# Patient Record
Sex: Female | Born: 1970 | Race: White | Hispanic: No | Marital: Married | Smoking: Never smoker
Health system: Southern US, Community
[De-identification: ages and names within clinical notes are randomized; demographics above are authoritative.]

## PROBLEM LIST (undated history)

## (undated) DIAGNOSIS — F419 Anxiety disorder, unspecified: Secondary | ICD-10-CM

## (undated) DIAGNOSIS — G4700 Insomnia, unspecified: Secondary | ICD-10-CM

## (undated) DIAGNOSIS — F32A Depression, unspecified: Secondary | ICD-10-CM

## (undated) DIAGNOSIS — K760 Fatty (change of) liver, not elsewhere classified: Secondary | ICD-10-CM

## (undated) DIAGNOSIS — E785 Hyperlipidemia, unspecified: Secondary | ICD-10-CM

## (undated) HISTORY — PX: ABDOMINAL HYSTERECTOMY: SHX81

## (undated) HISTORY — PX: COLONOSCOPY: SHX174

## (undated) HISTORY — DX: Anxiety disorder, unspecified: F41.9

## (undated) HISTORY — DX: Insomnia, unspecified: G47.00

## (undated) HISTORY — DX: Hyperlipidemia, unspecified: E78.5

## (undated) HISTORY — PX: TONSILLECTOMY: SUR1361

## (undated) HISTORY — PX: BREAST EXCISIONAL BIOPSY: SUR124

## (undated) HISTORY — DX: Depression, unspecified: F32.A

## (undated) HISTORY — DX: Fatty (change of) liver, not elsewhere classified: K76.0

## (undated) HISTORY — PX: TUBAL LIGATION: SHX77

---

## 2011-11-09 DIAGNOSIS — T884XXA Failed or difficult intubation, initial encounter: Secondary | ICD-10-CM

## 2011-11-09 HISTORY — PX: ABDOMINAL HYSTERECTOMY: SHX81

## 2011-11-09 HISTORY — DX: Failed or difficult intubation, initial encounter: T88.4XXA

## 2012-02-23 DIAGNOSIS — Z800 Family history of malignant neoplasm of digestive organs: Secondary | ICD-10-CM | POA: Insufficient documentation

## 2018-01-31 LAB — HM COLONOSCOPY

## 2019-10-29 HISTORY — PX: OOPHORECTOMY: SHX86

## 2020-06-30 DIAGNOSIS — Z803 Family history of malignant neoplasm of breast: Secondary | ICD-10-CM | POA: Insufficient documentation

## 2020-06-30 DIAGNOSIS — Z833 Family history of diabetes mellitus: Secondary | ICD-10-CM | POA: Insufficient documentation

## 2021-01-14 ENCOUNTER — Other Ambulatory Visit: Payer: Self-pay

## 2021-01-14 ENCOUNTER — Ambulatory Visit: Payer: BC Managed Care – PPO | Admitting: Family Medicine

## 2021-01-14 ENCOUNTER — Encounter: Payer: Self-pay | Admitting: Family Medicine

## 2021-01-14 VITALS — BP 130/87 | HR 81 | Temp 97.5°F | Ht 65.0 in | Wt 215.0 lb

## 2021-01-14 DIAGNOSIS — Z7689 Persons encountering health services in other specified circumstances: Secondary | ICD-10-CM

## 2021-01-14 DIAGNOSIS — Z90710 Acquired absence of both cervix and uterus: Secondary | ICD-10-CM

## 2021-01-14 DIAGNOSIS — Z23 Encounter for immunization: Secondary | ICD-10-CM | POA: Diagnosis not present

## 2021-01-14 DIAGNOSIS — E8941 Symptomatic postprocedural ovarian failure: Secondary | ICD-10-CM

## 2021-01-14 DIAGNOSIS — E782 Mixed hyperlipidemia: Secondary | ICD-10-CM

## 2021-01-14 DIAGNOSIS — G4700 Insomnia, unspecified: Secondary | ICD-10-CM | POA: Insufficient documentation

## 2021-01-14 DIAGNOSIS — Z1231 Encounter for screening mammogram for malignant neoplasm of breast: Secondary | ICD-10-CM

## 2021-01-14 DIAGNOSIS — F5101 Primary insomnia: Secondary | ICD-10-CM | POA: Diagnosis not present

## 2021-01-14 DIAGNOSIS — R030 Elevated blood-pressure reading, without diagnosis of hypertension: Secondary | ICD-10-CM | POA: Diagnosis not present

## 2021-01-14 DIAGNOSIS — Z6835 Body mass index (BMI) 35.0-35.9, adult: Secondary | ICD-10-CM

## 2021-01-14 MED ORDER — DOXEPIN HCL 10 MG PO CAPS: 10.0000 mg | ORAL_CAPSULE | Freq: Every evening | ORAL | 0 refills | Status: DC | PRN

## 2021-01-14 MED ORDER — ESTRADIOL 0.5 MG PO TABS: 0.5000 mg | ORAL_TABLET | Freq: Every day | ORAL | 6 refills | Status: DC

## 2021-01-14 MED ORDER — TIRZEPATIDE 5 MG/0.5ML ~~LOC~~ SOAJ: 5.0000 mg | SUBCUTANEOUS | 0 refills | Status: DC

## 2021-01-14 MED ORDER — MIRTAZAPINE 15 MG PO TABS: 15.0000 mg | ORAL_TABLET | Freq: Every day | ORAL | 6 refills | Status: DC

## 2021-01-14 MED ORDER — TIRZEPATIDE 2.5 MG/0.5ML ~~LOC~~ SOAJ: 2.5000 mg | SUBCUTANEOUS | 0 refills | Status: AC

## 2021-01-14 NOTE — Progress Notes (Signed)
Subjective:  Patient ID: Rebecca [name], female    DOB: 06-27-1970, 50 y.o.   MRN: [id number]  Patient Care Team: Baruch Gouty, FNP as PCP - General (Family Medicine)   Chief Complaint:  New Patient (Initial Visit) (Establish care, hx of high cholesterol)   HPI: Rebecca [name] is a 50 y.o. female presenting on 01/14/2021 for New Patient (Initial Visit) (Establish care, hx of high cholesterol)   Pt presents today to establish care with new PCP as pt has recently moved to the area from [place] state. Records have been requested, EHR database is incomplete. She moved to [place] about 1 month ago after her husband retired. She works with [employer] full time and has transitioned to her new job well. She has been a little stressed due to the move across country but feels things are starting to get better. She does have a difficult time sleeping and has been on alternating remeron and doxepin for several years to combat symptoms. She takes the doxepin for one week and the remeron on alternating weeks to keep from building up tolerance. This has worked well for her thus far. She had a total hysterectomy in 10/2018 after recurrent abnormal PAPs. During initial hysterectomy only one ovary was removed, she later developed an ovarian torsion which required the remaining ovary to be removed. She developed immediate menopausal symptoms and was started on estradiol. Since hysterectomy she has gained at least 20 lbs and continues to gain weight. She watches her diet and is active on a daily basis but unable to get the weight off and keep it off. She has tried phentermine in the past which was beneficial but this would possibly worsen her insomnia. She reports her LDL has been elevated in the past but has never been placed on medications.      Relevant past medical, surgical, family, and social history reviewed and updated as indicated.  Allergies and medications reviewed and  updated. Data reviewed: Chart in Epic.   Past Medical History:  Diagnosis Date   Anxiety    Hyperlipidemia     Past Surgical History:  Procedure Laterality Date   ABDOMINAL HYSTERECTOMY     TONSILLECTOMY Bilateral     Social History   Socioeconomic History   Marital status: Married    Spouse name: Not on file   Number of children: Not on file   Years of education: Not on file   Highest education level: Not on file  Occupational History   Not on file  Tobacco Use   Smoking status: Never   Smokeless tobacco: Never  Substance and Sexual Activity   Alcohol use: Yes    Comment: 1-2 drinks per week   Drug use: Not on file   Sexual activity: Not on file  Other Topics Concern   Not on file  Social History Narrative   Not on file   Social Determinants of Health   Financial Resource Strain: Not on file  Food Insecurity: Not on file  Transportation Needs: Not on file  Physical Activity: Not on file  Stress: Not on file  Social Connections: Not on file  Intimate Partner Violence: Not on file    Outpatient Encounter Medications as of 01/14/2021  Medication Sig   tirzepatide (MOUNJARO) 2.5 MG/0.5ML Pen Inject 2.5 mg into the skin once a week for 4 doses.   [START ON 02/12/2021] tirzepatide (MOUNJARO) 5 MG/0.5ML Pen Inject 5 mg into the skin once a week  for 8 doses.   [DISCONTINUED] doxepin (SINEQUAN) 10 MG capsule Take 10 mg by mouth.   [DISCONTINUED] estradiol (ESTRACE) 0.5 MG tablet Take 0.5 mg by mouth daily.   [DISCONTINUED] mirtazapine (REMERON) 15 MG tablet Take 15 mg by mouth at bedtime.   doxepin (SINEQUAN) 10 MG capsule Take 1 capsule (10 mg total) by mouth at bedtime as needed. Take 1-2 capsules at night as needed for insomnia   estradiol (ESTRACE) 0.5 MG tablet Take 1 tablet (0.5 mg total) by mouth daily.   mirtazapine (REMERON) 15 MG tablet Take 1 tablet (15 mg total) by mouth at bedtime.   No facility-administered encounter medications on file as of  01/14/2021.    No Known Allergies  Review of Systems  Constitutional:  Positive for unexpected weight change (weight gain). Negative for activity change, appetite change, chills, diaphoresis, fatigue and fever.  HENT: Negative.    Eyes: Negative.   Respiratory:  Negative for cough, chest tightness and shortness of breath.   Cardiovascular:  Negative for chest pain, palpitations and leg swelling.  Gastrointestinal:  Negative for blood in stool, constipation, diarrhea, nausea and vomiting.  Endocrine: Negative.  Negative for cold intolerance, heat intolerance, polydipsia, polyphagia and polyuria.  Genitourinary:  Negative for dysuria, frequency and urgency.       Hot flashes  Musculoskeletal:  Negative for arthralgias and myalgias.  Skin: Negative.   Allergic/Immunologic: Negative.   Neurological:  Negative for dizziness and headaches.  Hematological: Negative.   Psychiatric/Behavioral:  Positive for decreased concentration and sleep disturbance. Negative for confusion, hallucinations, self-injury and suicidal ideas.   All other systems reviewed and are negative.      Objective:  BP 130/87   Pulse 81   Temp (!) 97.5 F (36.4 C)   Ht _0  (1.651 m)   Wt 215 lb (97.5 kg)   LMP 10/30/2018 (Approximate) Comment: total hysterectmy  SpO2 97%   BMI 35.78 kg/m    Wt Readings from Last 3 Encounters:  01/14/21 215 lb (97.5 kg)    Physical Exam Vitals and nursing note reviewed.  Constitutional:      General: She is not in acute distress.    Appearance: Normal appearance. She is well-developed and well-groomed. She is obese. She is not ill-appearing, toxic-appearing or diaphoretic.  HENT:     Head: Normocephalic and atraumatic.     Jaw: There is normal jaw occlusion.     Right Ear: Hearing normal.     Left Ear: Hearing normal.     Nose: Nose normal.     Mouth/Throat:     Lips: Pink.     Mouth: Mucous membranes are moist.     Pharynx: Oropharynx is clear. Uvula midline.   Eyes:     General: Lids are normal.     Extraocular Movements: Extraocular movements intact.     Conjunctiva/sclera: Conjunctivae normal.     Pupils: Pupils are equal, round, and reactive to light.  Neck:     Thyroid: No thyroid mass, thyromegaly or thyroid tenderness.     Vascular: No carotid bruit or JVD.     Trachea: Trachea and phonation normal.  Cardiovascular:     Rate and Rhythm: Normal rate and regular rhythm.     Chest Wall: PMI is not displaced.     Pulses: Normal pulses.     Heart sounds: Normal heart sounds. No murmur heard.   No friction rub. No gallop.  Pulmonary:     Effort: Pulmonary effort is normal. No  respiratory distress.     Breath sounds: Normal breath sounds. No wheezing.  Abdominal:     General: Abdomen is protuberant. Bowel sounds are normal. There is no distension or abdominal bruit.     Palpations: Abdomen is soft. There is no hepatomegaly or splenomegaly.     Tenderness: There is no abdominal tenderness. There is no right CVA tenderness or left CVA tenderness.     Hernia: No hernia is present.  Musculoskeletal:        General: Normal range of motion.     Cervical back: Normal range of motion and neck supple.     Right lower leg: No edema.     Left lower leg: No edema.  Lymphadenopathy:     Cervical: No cervical adenopathy.  Skin:    General: Skin is warm and dry.     Capillary Refill: Capillary refill takes less than 2 seconds.     Coloration: Skin is not cyanotic, jaundiced or pale.     Findings: No rash.  Neurological:     General: No focal deficit present.     Mental Status: She is alert and oriented to person, place, and time.     Cranial Nerves: Cranial nerves are intact.     Sensory: Sensation is intact.     Motor: Motor function is intact.     Coordination: Coordination is intact.     Gait: Gait is intact. Gait normal.     Deep Tendon Reflexes: Reflexes are normal and symmetric.  Psychiatric:        Attention and Perception: Attention  and perception normal.        Mood and Affect: Mood and affect normal.        Speech: Speech normal.        Behavior: Behavior normal. Behavior is cooperative.        Thought Content: Thought content normal.        Cognition and Memory: Cognition and memory normal.        Judgment: Judgment normal.       Pertinent labs & imaging results that were available during my care of the patient were reviewed by me and considered in my medical decision making.  Assessment & Plan:  Rithika was seen today for new patient (initial visit).  Diagnoses and all orders for this visit:  Mixed hyperlipidemia Diet and exercise encouraged. Will check labs. Statin therapy will be initiated if indicated.  -     Lipid panel -     Hemoglobin A1c  Primary insomnia Has been doing well on alternating doxepin and remeron, will continue. Will check thyroid function. -     Thyroid Panel With TSH -     doxepin (SINEQUAN) 10 MG capsule; Take 1 capsule (10 mg total) by mouth at bedtime as needed. Take 1-2 capsules at night as needed for insomnia -     mirtazapine (REMERON) 15 MG tablet; Take 1 tablet (15 mg total) by mouth at bedtime.  Elevated blood pressure without diagnosis of hypertension Normal reading for pt 120/70 at home. DASH diet and exercise encouraged. Pt to monitor BP and report any persistent high readings. Labs pending.  -     CMP14+EGFR -     CBC with Differential/Platelet -     Lipid panel -     Thyroid Panel With TSH -     Hemoglobin A1c  BMI 35.0-35.9,adult Encounter for weight management Diet and exercise encouraged. Has been on phentermine in the past  but this would likely increase insomnia symptoms. Labs pending. No personal or family history of thyroid cancer. Will initiate Mounjaro as prescribed. Follow up in 6 weeks for BMI recheck.  -     CMP14+EGFR -     CBC with Differential/Platelet -     Lipid panel -     Thyroid Panel With TSH -     Hemoglobin A1c -     tirzepatide (MOUNJARO) 5  MG/0.5ML Pen; Inject 5 mg into the skin once a week for 8 doses. -     tirzepatide [facility]) 2.5 MG/0.5ML Pen; Inject 2.5 mg into the skin once a week for 4 doses.  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 Pt will try to decrease dose by half to see if beneficial. If dose does not worsen symptoms, will continue at lower dose.  -     estradiol (ESTRACE) 0.5 MG tablet; Take 1 tablet (0.5 mg total) by mouth daily.  Need for influenza vaccination -     Flu Vaccine QUAD 6+ mos PF IM (Fluarix Quad PF)  Encounter to establish care EHR database is incomplete, records have been requested. Will update health maintenance once records are received.   Screening for breast cancer Mammogram ordered. Pt would like to have on mobile imaging bus.  Continue all other maintenance medications.  Follow up plan: Return in about 6 weeks (around 02/25/2021), or if symptoms worsen or fail to improve, for BMI.   Continue healthy lifestyle choices, including diet (rich in fruits, vegetables, and lean proteins, and low in salt and simple carbohydrates) and exercise (at least 30 minutes of moderate physical activity daily).  Educational handout given for weight loss  The above assessment and management plan was discussed with the patient. The patient verbalized understanding of and has agreed to the management plan. Patient is aware to call the clinic if they develop any new symptoms or if symptoms persist or worsen. Patient is aware when to return to the clinic for a follow-up visit. Patient educated on when it is appropriate to go to the emergency department.   Monia Pouch, FNP-C [facility] [phone]

## 2021-01-15 ENCOUNTER — Encounter: Payer: Self-pay | Admitting: Family Medicine

## 2021-01-15 LAB — CBC WITH DIFFERENTIAL/PLATELET
Basophils Absolute: 0 10*3/uL (ref 0.0–0.2)
Basos: 0 %
EOS (ABSOLUTE): 0.1 10*3/uL (ref 0.0–0.4)
Eos: 1 %
Hematocrit: 38 % (ref 34.0–46.6)
Hemoglobin: 13.1 g/dL (ref 11.1–15.9)
Immature Grans (Abs): 0 10*3/uL (ref 0.0–0.1)
Immature Granulocytes: 0 %
Lymphocytes Absolute: 3.5 10*3/uL — ABNORMAL HIGH (ref 0.7–3.1)
Lymphs: 38 %
MCH: 30.9 pg (ref 26.6–33.0)
MCHC: 34.5 g/dL (ref 31.5–35.7)
MCV: 90 fL (ref 79–97)
Monocytes Absolute: 0.6 10*3/uL (ref 0.1–0.9)
Monocytes: 7 %
Neutrophils Absolute: 5 10*3/uL (ref 1.4–7.0)
Neutrophils: 54 %
Platelets: 295 10*3/uL (ref 150–450)
RBC: 4.24 x10E6/uL (ref 3.77–5.28)
RDW: 13.1 % (ref 11.7–15.4)
WBC: 9.2 10*3/uL (ref 3.4–10.8)

## 2021-01-15 LAB — CMP14+EGFR
ALT: 46 IU/L — ABNORMAL HIGH (ref 0–32)
AST: 28 IU/L (ref 0–40)
Albumin/Globulin Ratio: 1.6 (ref 1.2–2.2)
Albumin: 4.5 g/dL (ref 3.8–4.8)
Alkaline Phosphatase: 77 IU/L (ref 44–121)
BUN/Creatinine Ratio: 13 (ref 9–23)
BUN: 12 mg/dL (ref 6–24)
Bilirubin Total: 0.3 mg/dL (ref 0.0–1.2)
CO2: 23 mmol/L (ref 20–29)
Calcium: 9.7 mg/dL (ref 8.7–10.2)
Chloride: 100 mmol/L (ref 96–106)
Creatinine, Ser: 0.9 mg/dL (ref 0.57–1.00)
Globulin, Total: 2.8 g/dL (ref 1.5–4.5)
Glucose: 87 mg/dL (ref 65–99)
Potassium: 3.8 mmol/L (ref 3.5–5.2)
Sodium: 139 mmol/L (ref 134–144)
Total Protein: 7.3 g/dL (ref 6.0–8.5)
eGFR: 79 mL/min/{1.73_m2} (ref >59)

## 2021-01-15 LAB — THYROID PANEL WITH TSH
Free Thyroxine Index: 1.9 (ref 1.2–4.9)
T3 Uptake Ratio: 24 % (ref 24–39)
T4, Total: 8 ug/dL (ref 4.5–12.0)
TSH: 2.39 u[IU]/mL (ref 0.450–4.500)

## 2021-01-15 LAB — LIPID PANEL
Chol/HDL Ratio: 5 ratio — ABNORMAL HIGH (ref 0.0–4.4)
Cholesterol, Total: 254 mg/dL — ABNORMAL HIGH (ref 100–199)
HDL: 51 mg/dL (ref >39)
LDL Chol Calc (NIH): 176 mg/dL — ABNORMAL HIGH (ref 0–99)
Triglycerides: 146 mg/dL (ref 0–149)
VLDL Cholesterol Cal: 27 mg/dL (ref 5–40)

## 2021-01-15 LAB — HEMOGLOBIN A1C
Est. average glucose Bld gHb Est-mCnc: 111 mg/dL
Hgb A1c MFr Bld: 5.5 % (ref 4.8–5.6)

## 2021-01-15 MED ORDER — ATORVASTATIN CALCIUM 20 MG PO TABS
20.0000 mg | ORAL_TABLET | Freq: Every day | ORAL | 3 refills | Status: DC
Start: 2021-01-15 — End: 2021-12-02

## 2021-01-15 NOTE — Addendum Note (Signed)
Addended by: Sonny Masters on: 01/15/2021 09:13 AM   Modules accepted: Orders

## 2021-01-15 NOTE — Telephone Encounter (Signed)
The ALT is minimally elevated. We will recheck it at next visit. If still elevated, we will discuss medications and potential causes other than tylenol.

## 2021-01-25 ENCOUNTER — Telehealth: Payer: Self-pay | Admitting: *Deleted

## 2021-01-25 DIAGNOSIS — Z6835 Body mass index (BMI) 35.0-35.9, adult: Secondary | ICD-10-CM

## 2021-01-25 NOTE — Telephone Encounter (Signed)
Mounjaro 2.5MG /0.5ML pen-injectors PA started - weight loss   (Key: [id number])   Form [facility] Commercial General Form General Prior Authorization Form for [employer] members [phone]  PA not initiated - coupon available

## 2021-02-04 ENCOUNTER — Encounter: Payer: Self-pay | Admitting: *Deleted

## 2021-03-02 ENCOUNTER — Ambulatory Visit: Payer: Self-pay | Admitting: Family Medicine

## 2021-03-02 ENCOUNTER — Ambulatory Visit: Payer: BC Managed Care – PPO | Admitting: Family Medicine

## 2021-03-02 ENCOUNTER — Encounter: Payer: Self-pay | Admitting: Family Medicine

## 2021-03-02 ENCOUNTER — Other Ambulatory Visit: Payer: Self-pay

## 2021-03-02 VITALS — BP 127/81 | HR 95 | Temp 98.0°F | Ht 66.0 in | Wt 211.8 lb

## 2021-03-02 DIAGNOSIS — R030 Elevated blood-pressure reading, without diagnosis of hypertension: Secondary | ICD-10-CM | POA: Diagnosis not present

## 2021-03-02 DIAGNOSIS — Z6835 Body mass index (BMI) 35.0-35.9, adult: Secondary | ICD-10-CM

## 2021-03-02 DIAGNOSIS — K219 Gastro-esophageal reflux disease without esophagitis: Secondary | ICD-10-CM | POA: Diagnosis not present

## 2021-03-02 DIAGNOSIS — Z789 Other specified health status: Secondary | ICD-10-CM | POA: Diagnosis not present

## 2021-03-02 MED ORDER — TIRZEPATIDE 7.5 MG/0.5ML ~~LOC~~ SOAJ: 7.5000 mg | SUBCUTANEOUS | 0 refills | Status: AC

## 2021-03-02 MED ORDER — OMEPRAZOLE 20 MG PO CPDR
20.0000 mg | DELAYED_RELEASE_CAPSULE | Freq: Every day | ORAL | 3 refills | Status: DC
Start: 2021-03-02 — End: 2021-06-21

## 2021-03-02 MED ORDER — TIRZEPATIDE 10 MG/0.5ML ~~LOC~~ SOAJ: 10.0000 mg | SUBCUTANEOUS | 0 refills | Status: AC

## 2021-03-02 NOTE — Progress Notes (Signed)
Subjective:  Patient ID: Rebecca [name], female    DOB: 1970-10-21, 50 y.o.   MRN: [id number]  Patient Care Team: Baruch Gouty, FNP as PCP - General (Family Medicine)   Chief Complaint:  Weight Check [name])   HPI: Rebecca [name] is a 50 y.o. female presenting on 03/02/2021 for Weight Check [name])   Patient presents today for follow-up for weight loss management.  She has been limiting her intake of fatty foods.  She has been more cognizant about her water intake and doing better with such.  She is active on a regular basis but does not follow a exercise routine.  She states she did notice when she increased to the 5 mg of Mounjaro that she started having acid reflux.  She has been taking times for this with control of symptoms.  No cough, voice change, trouble swallowing, hemoptysis, or dysphagia.  No abdominal pain.  She does have slight constipation which is relieved by senna. She continues to have periods of insomnia but she is only been taking half of a 5 mg tablet of melatonin at night.  Increasing dose discussed in detail.    Relevant past medical, surgical, family, and social history reviewed and updated as indicated.  Allergies and medications reviewed and updated. Data reviewed: Chart in Epic.   Past Medical History:  Diagnosis Date   Anxiety    Hyperlipidemia    Insomnia     Past Surgical History:  Procedure Laterality Date   ABDOMINAL HYSTERECTOMY     TONSILLECTOMY Bilateral     Social History   Socioeconomic History   Marital status: Married    Spouse name: Not on file   Number of children: Not on file   Years of education: Not on file   Highest education level: Not on file  Occupational History   Occupation: [employer]  Tobacco Use   Smoking status: Never   Smokeless tobacco: Never  Vaping Use   Vaping Use: Never used  Substance and Sexual Activity   Alcohol use: Yes    Comment: 1-2 drinks per week   Drug use:  Never   Sexual activity: Yes    Comment: total hysterectomy  Other Topics Concern   Not on file  Social History Narrative   Not on file   Social Determinants of Health   Financial Resource Strain: Not on file  Food Insecurity: Not on file  Transportation Needs: Not on file  Physical Activity: Not on file  Stress: Not on file  Social Connections: Not on file  Intimate Partner Violence: Not on file    Outpatient Encounter Medications as of 03/02/2021  Medication Sig   omeprazole (PRILOSEC) 20 MG capsule Take 1 capsule (20 mg total) by mouth daily.   [START ON 04/06/2021] tirzepatide (MOUNJARO) 10 MG/0.5ML Pen Inject 10 mg into the skin once a week for 4 doses.   tirzepatide (MOUNJARO) 7.5 MG/0.5ML Pen Inject 7.5 mg into the skin once a week for 4 doses.   atorvastatin (LIPITOR) 20 MG tablet Take 1 tablet (20 mg total) by mouth daily.   doxepin (SINEQUAN) 10 MG capsule Take 1 capsule (10 mg total) by mouth at bedtime as needed. Take 1-2 capsules at night as needed for insomnia   estradiol (ESTRACE) 0.5 MG tablet Take 1 tablet (0.5 mg total) by mouth daily.   mirtazapine (REMERON) 15 MG tablet Take 1 tablet (15 mg total) by mouth at bedtime.   [DISCONTINUED] tirzepatide [name])  5 MG/0.5ML Pen Inject 5 mg into the skin once a week for 8 doses.   No facility-administered encounter medications on file as of 03/02/2021.    No Known Allergies  Review of Systems  Constitutional:  Negative for activity change, appetite change, chills, diaphoresis, fatigue, fever and unexpected weight change.  HENT: Negative.  Negative for sore throat, trouble swallowing and voice change.   Eyes: Negative.   Respiratory:  Negative for apnea, cough, choking, chest tightness, shortness of breath, wheezing and stridor.   Cardiovascular:  Negative for chest pain, palpitations and leg swelling.  Gastrointestinal:  Negative for abdominal distention, abdominal pain, anal bleeding, blood in stool, constipation,  diarrhea, nausea, rectal pain and vomiting.       GERD  Endocrine: Negative.   Genitourinary:  Negative for decreased urine volume, difficulty urinating, dysuria, frequency and urgency.  Musculoskeletal:  Negative for arthralgias and myalgias.  Skin: Negative.   Allergic/Immunologic: Negative.   Neurological:  Negative for dizziness, tremors, seizures, syncope, facial asymmetry, speech difficulty, weakness, light-headedness, numbness and headaches.  Hematological: Negative.   Psychiatric/Behavioral:  Positive for sleep disturbance. Negative for agitation, behavioral problems, confusion, decreased concentration, dysphoric mood, hallucinations, self-injury and suicidal ideas. The patient is not nervous/anxious and is not hyperactive.   All other systems reviewed and are negative.      Objective:  BP 127/81   Pulse 95   Temp 98 F (36.7 C)   Ht '5\' 6"'  (1.676 m)   Wt 211 lb 12.8 oz (96.1 kg)   LMP 10/30/2018 (Approximate) Comment: total hysterectmy  SpO2 96%   BMI 34.19 kg/m    Wt Readings from Last 3 Encounters:  03/02/21 211 lb 12.8 oz (96.1 kg)  01/14/21 215 lb (97.5 kg)    Physical Exam Vitals and nursing note reviewed.  Constitutional:      General: She is not in acute distress.    Appearance: Normal appearance. She is well-developed and well-groomed. She is obese. She is not ill-appearing, toxic-appearing or diaphoretic.  HENT:     Head: Normocephalic and atraumatic.     Jaw: There is normal jaw occlusion.     Right Ear: Hearing normal.     Left Ear: Hearing normal.     Nose: Nose normal.     Mouth/Throat:     Lips: Pink.     Mouth: Mucous membranes are moist.     Pharynx: Oropharynx is clear. Uvula midline.  Eyes:     General: Lids are normal.     Extraocular Movements: Extraocular movements intact.     Conjunctiva/sclera: Conjunctivae normal.     Pupils: Pupils are equal, round, and reactive to light.  Neck:     Thyroid: No thyroid mass, thyromegaly or thyroid  tenderness.     Vascular: No carotid bruit or JVD.     Trachea: Trachea and phonation normal.  Cardiovascular:     Rate and Rhythm: Normal rate and regular rhythm.     Chest Wall: PMI is not displaced.     Pulses: Normal pulses.     Heart sounds: Normal heart sounds. No murmur heard.   No friction rub. No gallop.  Pulmonary:     Effort: Pulmonary effort is normal. No respiratory distress.     Breath sounds: Normal breath sounds. No wheezing.  Abdominal:     General: Bowel sounds are normal. There is no distension or abdominal bruit.     Palpations: Abdomen is soft. There is no hepatomegaly or splenomegaly.  Tenderness: There is no abdominal tenderness. There is no right CVA tenderness or left CVA tenderness.     Hernia: No hernia is present.  Musculoskeletal:        General: Normal range of motion.     Cervical back: Normal range of motion and neck supple.     Right lower leg: No edema.     Left lower leg: No edema.  Lymphadenopathy:     Cervical: No cervical adenopathy.  Skin:    General: Skin is warm and dry.     Capillary Refill: Capillary refill takes less than 2 seconds.     Coloration: Skin is not cyanotic, jaundiced or pale.     Findings: No rash.  Neurological:     General: No focal deficit present.     Mental Status: She is alert and oriented to person, place, and time.     Sensory: Sensation is intact.     Motor: Motor function is intact.     Coordination: Coordination is intact.     Gait: Gait is intact.     Deep Tendon Reflexes: Reflexes are normal and symmetric.  Psychiatric:        Attention and Perception: Attention and perception normal.        Mood and Affect: Mood and affect normal.        Speech: Speech normal.        Behavior: Behavior normal. Behavior is cooperative.        Thought Content: Thought content normal.        Cognition and Memory: Cognition and memory normal.        Judgment: Judgment normal.    Results for orders placed or performed  in visit on 02/04/21  HM COLONOSCOPY  Result Value Ref Range   HM Colonoscopy See Report (in chart) See Report (in chart), Patient Reported       Pertinent labs & imaging results that were available during my care of the patient were reviewed by me and considered in my medical decision making.  Assessment & Plan:  Manasa was seen today for weight check.  Diagnoses and all orders for this visit:  BMI 35.0-35.9,adult Educated about management of weight Has lost 4 pounds on [place].  Diet and exercise encouraged.  Will increase dose to 7.5 mg/week for 4 weeks and then 10 mg/week for 4 weeks.  We will recheck CBC and CMP today. -     CMP14+EGFR -     CBC with Differential/Platelet -     tirzepatide (MOUNJARO) 7.5 MG/0.5ML Pen; Inject 7.5 mg into the skin once a week for 4 doses. -     tirzepatide (MOUNJARO) 10 MG/0.5ML Pen; Inject 10 mg into the skin once a week for 4 doses.  Elevated blood pressure reading without diagnosis of hypertension Better controlled with weight loss and decreased dose of hormone replacement therapy. DASHDiet and exercise encouraged.  Monitor blood pressure at home and report any persistent high readings. -     CMP14+EGFR -     CBC with Differential/Platelet  Gastroesophageal reflux disease without esophagitis Acid reflux symptoms with Mounjaro therapy.  We will add omeprazole as prescribed.  No red flags present.  Patient aware to report any new or worsening symptoms. -     omeprazole (PRILOSEC) 20 MG capsule; Take 1 capsule (20 mg total) by mouth daily.    Continue all other maintenance medications.  Follow up plan: Return in about 8 years (around 03/02/2029), or if symptoms worsen  or fail to improve, for BMI.   Continue healthy lifestyle choices, including diet (rich in fruits, vegetables, and lean proteins, and low in salt and simple carbohydrates) and exercise (at least 30 minutes of moderate physical activity daily).  Educational handout given for  insomnia   The above assessment and management plan was discussed with the patient. The patient verbalized understanding of and has agreed to the management plan. Patient is aware to call the clinic if they develop any new symptoms or if symptoms persist or worsen. Patient is aware when to return to the clinic for a follow-up visit. Patient educated on when it is appropriate to go to the emergency department.   Monia Pouch, FNP-C [facility] [phone]

## 2021-03-03 LAB — CMP14+EGFR
ALT: 42 IU/L — ABNORMAL HIGH (ref 0–32)
AST: 22 IU/L (ref 0–40)
Albumin/Globulin Ratio: 1.6 (ref 1.2–2.2)
Albumin: 4.7 g/dL (ref 3.8–4.8)
Alkaline Phosphatase: 100 IU/L (ref 44–121)
BUN/Creatinine Ratio: 12 (ref 9–23)
BUN: 10 mg/dL (ref 6–24)
Bilirubin Total: 0.3 mg/dL (ref 0.0–1.2)
CO2: 25 mmol/L (ref 20–29)
Calcium: 10.8 mg/dL — ABNORMAL HIGH (ref 8.7–10.2)
Chloride: 99 mmol/L (ref 96–106)
Creatinine, Ser: 0.84 mg/dL (ref 0.57–1.00)
Globulin, Total: 2.9 g/dL (ref 1.5–4.5)
Glucose: 73 mg/dL (ref 70–99)
Potassium: 4.2 mmol/L (ref 3.5–5.2)
Sodium: 138 mmol/L (ref 134–144)
Total Protein: 7.6 g/dL (ref 6.0–8.5)
eGFR: 86 mL/min/{1.73_m2} (ref >59)

## 2021-03-03 LAB — CBC WITH DIFFERENTIAL/PLATELET
Basophils Absolute: 0.1 10*3/uL (ref 0.0–0.2)
Basos: 1 %
EOS (ABSOLUTE): 0 10*3/uL (ref 0.0–0.4)
Eos: 0 %
Hematocrit: 43.3 % (ref 34.0–46.6)
Hemoglobin: 14.9 g/dL (ref 11.1–15.9)
Immature Grans (Abs): 0 10*3/uL (ref 0.0–0.1)
Immature Granulocytes: 0 %
Lymphocytes Absolute: 3.5 10*3/uL — ABNORMAL HIGH (ref 0.7–3.1)
Lymphs: 34 %
MCH: 31.1 pg (ref 26.6–33.0)
MCHC: 34.4 g/dL (ref 31.5–35.7)
MCV: 90 fL (ref 79–97)
Monocytes Absolute: 0.7 10*3/uL (ref 0.1–0.9)
Monocytes: 7 %
Neutrophils Absolute: 5.9 10*3/uL (ref 1.4–7.0)
Neutrophils: 58 %
Platelets: 320 10*3/uL (ref 150–450)
RBC: 4.79 x10E6/uL (ref 3.77–5.28)
RDW: 12.9 % (ref 11.7–15.4)
WBC: 10.2 10*3/uL (ref 3.4–10.8)

## 2021-04-27 ENCOUNTER — Ambulatory Visit: Payer: BC Managed Care – PPO | Admitting: Family Medicine

## 2021-04-28 ENCOUNTER — Encounter: Payer: Self-pay | Admitting: Family Medicine

## 2021-04-28 ENCOUNTER — Other Ambulatory Visit: Payer: Self-pay

## 2021-04-28 ENCOUNTER — Ambulatory Visit
Admission: RE | Admit: 2021-04-28 | Discharge: 2021-04-28 | Disposition: A | Discharge status: Home / Self Care | Payer: Self-pay | Source: Ambulatory Visit | Attending: Family Medicine | Admitting: Family Medicine

## 2021-04-28 ENCOUNTER — Ambulatory Visit: Payer: BC Managed Care – PPO | Admitting: Family Medicine

## 2021-04-28 VITALS — BP 120/80 | HR 90 | Temp 97.7°F | Ht 66.0 in | Wt 213.2 lb

## 2021-04-28 DIAGNOSIS — Z7689 Persons encountering health services in other specified circumstances: Secondary | ICD-10-CM

## 2021-04-28 DIAGNOSIS — Z6834 Body mass index (BMI) 34.0-34.9, adult: Secondary | ICD-10-CM

## 2021-04-28 DIAGNOSIS — F320 Major depressive disorder, single episode, mild: Secondary | ICD-10-CM | POA: Diagnosis not present

## 2021-04-28 DIAGNOSIS — Z789 Other specified health status: Secondary | ICD-10-CM | POA: Diagnosis not present

## 2021-04-28 DIAGNOSIS — F5101 Primary insomnia: Secondary | ICD-10-CM | POA: Diagnosis not present

## 2021-04-28 DIAGNOSIS — F411 Generalized anxiety disorder: Secondary | ICD-10-CM

## 2021-04-28 DIAGNOSIS — Z1231 Encounter for screening mammogram for malignant neoplasm of breast: Secondary | ICD-10-CM

## 2021-04-28 MED ORDER — TIRZEPATIDE 12.5 MG/0.5ML ~~LOC~~ SOAJ: 12.5000 mg | SUBCUTANEOUS | 0 refills | Status: AC

## 2021-04-28 MED ORDER — DOXEPIN HCL 25 MG PO CAPS: 25.0000 mg | ORAL_CAPSULE | Freq: Every day | ORAL | 5 refills | Status: DC

## 2021-04-28 MED ORDER — FLUOXETINE HCL 10 MG PO CAPS: 10.0000 mg | ORAL_CAPSULE | Freq: Every day | ORAL | 3 refills | Status: DC

## 2021-04-28 NOTE — Patient Instructions (Signed)

## 2021-04-28 NOTE — Progress Notes (Signed)
Subjective:  Patient ID: Rebecca [name], female    DOB: Mar 22, 1971, 50 y.o.   MRN: [id number]  Patient Care Team: Baruch Gouty, FNP as PCP - General (Family Medicine)   Chief Complaint:  Insomnia and Obesity   HPI: Rebecca [name] is a 50 y.o. female presenting on 04/28/2021 for Insomnia and Obesity   Patient presents today for follow-up for weight management.  She was started on Rytary and has titrated up to 10 mg weekly dosing.  She has actually gained today not lost.  States her diet has not changed but she does not follow a strict diet either.  She states her only exercise is walking her dog daily.  She does work in a [employer] and is on her feet for 12 hours a day.  24-hour diet recall: Breakfast coffee, lunch what ever the school has that day, dinner meat and veggies.  She states she does not snack throughout the day and avoids bread and pasta.  When she does eat bread it is the wheat sammies.  She states she does drink Gatorade, ice water, caffeine free diet Pepsi, and sweet tea.  She also reports worsening insomnia, anxiety, and depression.  States that she is anxious most of the time and has trouble concentrating.  She denies any SI or HI.  She was on Effexor in the past, did not tolerate due to weight problems.  She is currently on lorazepam as needed for sleep and doxepin as needed for sleep.  Does not take both at the same time.  Depression screen [facility] 2/9 04/28/2021 03/02/2021 01/14/2021  Decreased Interest 1 0 0  Down, Depressed, Hopeless 1 0 0  PHQ - 2 Score 2 0 0  Altered sleeping '3 3 3  ' Tired, decreased energy 2 0 0  Change in appetite 1 0 0  Feeling bad or failure about yourself  1 0 0  Trouble concentrating 3 1 0  Moving slowly or fidgety/restless 0 0 0  Suicidal thoughts 0 0 0  PHQ-9 Score '12 4 3  ' Difficult doing work/chores Somewhat difficult Somewhat difficult -   GAD 7 : Generalized Anxiety Score 04/28/2021 03/02/2021 01/14/2021  Nervous,  Anxious, on Edge '1 2 3  ' Control/stop worrying '1 2 3  ' Worry too much - different things '1 2 2  ' Trouble relaxing '1 2 2  ' Restless '1 2 2  ' Easily annoyed or irritable '1 2 1  ' Afraid - awful might happen 0 1 0  Total GAD 7 Score '6 13 13  ' Anxiety Difficulty Somewhat difficult Somewhat difficult -         Relevant past medical, surgical, family, and social history reviewed and updated as indicated.  Allergies and medications reviewed and updated. Data reviewed: Chart in Epic.   Past Medical History:  Diagnosis Date   Anxiety    Hyperlipidemia    Insomnia     Past Surgical History:  Procedure Laterality Date   ABDOMINAL HYSTERECTOMY     TONSILLECTOMY Bilateral     Social History   Socioeconomic History   Marital status: Married    Spouse name: Not on file   Number of children: Not on file   Years of education: Not on file   Highest education level: Not on file  Occupational History   Occupation: [employer]  Tobacco Use   Smoking status: Never   Smokeless tobacco: Never  Vaping Use   Vaping Use: Never used  Substance and Sexual  Activity   Alcohol use: Yes    Comment: 1-2 drinks per week   Drug use: Never   Sexual activity: Yes    Comment: total hysterectomy  Other Topics Concern   Not on file  Social History Narrative   Not on file   Social Determinants of Health   Financial Resource Strain: Not on file  Food Insecurity: Not on file  Transportation Needs: Not on file  Physical Activity: Not on file  Stress: Not on file  Social Connections: Not on file  Intimate Partner Violence: Not on file    Outpatient Encounter Medications as of 04/28/2021  Medication Sig   atorvastatin (LIPITOR) 20 MG tablet Take 1 tablet (20 mg total) by mouth daily.   doxepin (SINEQUAN) 25 MG capsule Take 1 capsule (25 mg total) by mouth at bedtime.   estradiol (ESTRACE) 0.5 MG tablet Take 1 tablet (0.5 mg total) by mouth daily.   FLUoxetine (PROZAC) 10 MG capsule  Take 1 capsule (10 mg total) by mouth daily.   tirzepatide (MOUNJARO) 12.5 MG/0.5ML Pen Inject 12.5 mg into the skin once a week for 4 doses.   [DISCONTINUED] doxepin (SINEQUAN) 10 MG capsule Take 1 capsule (10 mg total) by mouth at bedtime as needed. Take 1-2 capsules at night as needed for insomnia   [DISCONTINUED] mirtazapine (REMERON) 15 MG tablet Take 1 tablet (15 mg total) by mouth at bedtime.   omeprazole (PRILOSEC) 20 MG capsule Take 1 capsule (20 mg total) by mouth daily.   tirzepatide (MOUNJARO) 10 MG/0.5ML Pen Inject 10 mg into the skin once a week for 4 doses.   No facility-administered encounter medications on file as of 04/28/2021.    No Known Allergies  Review of Systems  Respiratory:  Negative for cough and shortness of breath.        Objective:  BP 120/80    Pulse 90    Temp 97.7 F (36.5 C) (Temporal)    Ht '5\' 6"'  (1.676 m)    Wt 213 lb 4 oz (96.7 kg)    LMP 10/30/2018 (Approximate) Comment: total hysterectmy   BMI 34.42 kg/m    Wt Readings from Last 3 Encounters:  04/28/21 213 lb 4 oz (96.7 kg)  03/02/21 211 lb 12.8 oz (96.1 kg)  01/14/21 215 lb (97.5 kg)    Physical Exam Vitals and nursing note reviewed.  Constitutional:      General: She is not in acute distress.    Appearance: Normal appearance. She is obese. She is not ill-appearing, toxic-appearing or diaphoretic.  HENT:     Nose: Nose normal.     Mouth/Throat:     Mouth: Mucous membranes are moist.  Eyes:     Conjunctiva/sclera: Conjunctivae normal.     Pupils: Pupils are equal, round, and reactive to light.  Cardiovascular:     Rate and Rhythm: Regular rhythm.     Heart sounds: Normal heart sounds. No murmur heard.   No friction rub. No gallop.  Pulmonary:     Effort: Pulmonary effort is normal.     Breath sounds: Normal breath sounds.  Skin:    General: Skin is warm and dry.     Capillary Refill: Capillary refill takes less than 2 seconds.  Neurological:     General: No focal deficit  present.     Mental Status: She is alert and oriented to person, place, and time.  Psychiatric:        Mood and Affect: Mood normal.  Behavior: Behavior normal.        Thought Content: Thought content normal.        Judgment: Judgment normal.    Results for orders placed or performed in visit on 03/02/21  CMP14+EGFR  Result Value Ref Range   Glucose 73 70 - 99 mg/dL   BUN 10 6 - 24 mg/dL   Creatinine, Ser 0.84 0.57 - 1.00 mg/dL   eGFR 86 >59 mL/min/1.73   BUN/Creatinine Ratio 12 9 - 23   Sodium 138 134 - 144 mmol/L   Potassium 4.2 3.5 - 5.2 mmol/L   Chloride 99 96 - 106 mmol/L   CO2 25 20 - 29 mmol/L   Calcium 10.8 (H) 8.7 - 10.2 mg/dL   Total Protein 7.6 6.0 - 8.5 g/dL   Albumin 4.7 3.8 - 4.8 g/dL   Globulin, Total 2.9 1.5 - 4.5 g/dL   Albumin/Globulin Ratio 1.6 1.2 - 2.2   Bilirubin Total 0.3 0.0 - 1.2 mg/dL   Alkaline Phosphatase 100 44 - 121 IU/L   AST 22 0 - 40 IU/L   ALT 42 (H) 0 - 32 IU/L  CBC with Differential/Platelet  Result Value Ref Range   WBC 10.2 3.4 - 10.8 x10E3/uL   RBC 4.79 3.77 - 5.28 x10E6/uL   Hemoglobin 14.9 11.1 - 15.9 g/dL   Hematocrit 43.3 34.0 - 46.6 %   MCV 90 79 - 97 fL   MCH 31.1 26.6 - 33.0 pg   MCHC 34.4 31.5 - 35.7 g/dL   RDW 12.9 11.7 - 15.4 %   Platelets 320 150 - 450 x10E3/uL   Neutrophils 58 Not Estab. %   Lymphs 34 Not Estab. %   Monocytes 7 Not Estab. %   Eos 0 Not Estab. %   Basos 1 Not Estab. %   Neutrophils Absolute 5.9 1.4 - 7.0 x10E3/uL   Lymphocytes Absolute 3.5 (H) 0.7 - 3.1 x10E3/uL   Monocytes Absolute 0.7 0.1 - 0.9 x10E3/uL   EOS (ABSOLUTE) 0.0 0.0 - 0.4 x10E3/uL   Basophils Absolute 0.1 0.0 - 0.2 x10E3/uL   Immature Granulocytes 0 Not Estab. %   Immature Grans (Abs) 0.0 0.0 - 0.1 x10E3/uL       Pertinent labs & imaging results that were available during my care of the patient were reviewed by me and considered in my medical decision making.  Assessment & Plan:  Haydee was seen today for insomnia and  obesity.  Diagnoses and all orders for this visit:  Encounter for weight management Educated about management of weight BMI 34.0-34.9,adult Significant education provided on the need for lifestyle changes for successful weight management.  Water intake and dietary changes discussed in detail.  Patient aware she has to be more active outside of work.  We will dose up to 12.5 of Mounjaro.  Patient aware if she does not lose with this dosing, she will be referred to weight management. -     tirzepatide (MOUNJARO) 12.5 MG/0.5ML Pen; Inject 12.5 mg into the skin once a week for 4 doses.  Primary insomnia Depression, major, single episode, mild (HCC) GAD (generalized anxiety disorder) Will discontinue mirtazapine.  Increase doxepin dosing nightly.  Start fluoxetine daily.  Patient aware to monitor for side effects of serotonin syndrome.  No SI or HI.  Patient to follow-up in 4 to 6 weeks for reevaluation, or sooner if needed. -     doxepin (SINEQUAN) 25 MG capsule; Take 1 capsule (25 mg total) by mouth at bedtime. -  FLUoxetine (PROZAC) 10 MG capsule; Take 1 capsule (10 mg total) by mouth daily.     Continue all other maintenance medications.  Follow up plan: Return in about 6 weeks (around 06/09/2021), or if symptoms worsen or fail to improve, for GAD, Depression, Anxiety.   Continue healthy lifestyle choices, including diet (rich in fruits, vegetables, and lean proteins, and low in salt and simple carbohydrates) and exercise (at least 30 minutes of moderate physical activity daily).  Educational handout given for depression, weight loss  The above assessment and management plan was discussed with the patient. The patient verbalized understanding of and has agreed to the management plan. Patient is aware to call the clinic if they develop any new symptoms or if symptoms persist or worsen. Patient is aware when to return to the clinic for a follow-up visit. Patient educated on when it is  appropriate to go to the emergency department.   Monia Pouch, FNP-C [facility] [phone]

## 2021-05-12 ENCOUNTER — Other Ambulatory Visit: Payer: Self-pay

## 2021-05-12 ENCOUNTER — Other Ambulatory Visit: Payer: Self-pay | Admitting: Family Medicine

## 2021-05-12 DIAGNOSIS — R928 Other abnormal and inconclusive findings on diagnostic imaging of breast: Secondary | ICD-10-CM

## 2021-05-14 ENCOUNTER — Other Ambulatory Visit: Payer: Self-pay | Admitting: Family Medicine

## 2021-05-14 DIAGNOSIS — R928 Other abnormal and inconclusive findings on diagnostic imaging of breast: Secondary | ICD-10-CM

## 2021-05-22 ENCOUNTER — Other Ambulatory Visit: Payer: Self-pay | Admitting: Family Medicine

## 2021-05-22 DIAGNOSIS — F5101 Primary insomnia: Secondary | ICD-10-CM

## 2021-05-22 DIAGNOSIS — F320 Major depressive disorder, single episode, mild: Secondary | ICD-10-CM

## 2021-05-22 DIAGNOSIS — F411 Generalized anxiety disorder: Secondary | ICD-10-CM

## 2021-06-01 ENCOUNTER — Other Ambulatory Visit: Payer: Self-pay | Admitting: Family Medicine

## 2021-06-01 DIAGNOSIS — F411 Generalized anxiety disorder: Secondary | ICD-10-CM

## 2021-06-01 DIAGNOSIS — F320 Major depressive disorder, single episode, mild: Secondary | ICD-10-CM

## 2021-06-01 DIAGNOSIS — F5101 Primary insomnia: Secondary | ICD-10-CM

## 2021-06-02 ENCOUNTER — Ambulatory Visit: Payer: BC Managed Care – PPO | Admitting: Family Medicine

## 2021-06-09 ENCOUNTER — Ambulatory Visit: Payer: BC Managed Care – PPO | Admitting: Family Medicine

## 2021-06-09 ENCOUNTER — Ambulatory Visit
Admission: RE | Admit: 2021-06-09 | Discharge: 2021-06-09 | Disposition: A | Discharge status: Home / Self Care | Payer: BC Managed Care – PPO | Source: Ambulatory Visit | Attending: Family Medicine | Admitting: Family Medicine

## 2021-06-09 DIAGNOSIS — R928 Other abnormal and inconclusive findings on diagnostic imaging of breast: Secondary | ICD-10-CM

## 2021-06-20 ENCOUNTER — Other Ambulatory Visit: Payer: Self-pay | Admitting: Family Medicine

## 2021-06-20 DIAGNOSIS — K219 Gastro-esophageal reflux disease without esophagitis: Secondary | ICD-10-CM

## 2021-06-24 ENCOUNTER — Ambulatory Visit (INDEPENDENT_AMBULATORY_CARE_PROVIDER_SITE_OTHER): Payer: BC Managed Care – PPO

## 2021-06-24 ENCOUNTER — Other Ambulatory Visit: Payer: Self-pay | Admitting: Family Medicine

## 2021-06-24 ENCOUNTER — Ambulatory Visit: Payer: BC Managed Care – PPO | Admitting: Family Medicine

## 2021-06-24 ENCOUNTER — Encounter: Payer: Self-pay | Admitting: Family Medicine

## 2021-06-24 VITALS — BP 110/74 | HR 97 | Temp 98.2°F | Ht 66.0 in | Wt 201.0 lb

## 2021-06-24 DIAGNOSIS — M25511 Pain in right shoulder: Secondary | ICD-10-CM

## 2021-06-24 DIAGNOSIS — G8929 Other chronic pain: Secondary | ICD-10-CM

## 2021-06-24 DIAGNOSIS — F5101 Primary insomnia: Secondary | ICD-10-CM

## 2021-06-24 DIAGNOSIS — Z6835 Body mass index (BMI) 35.0-35.9, adult: Secondary | ICD-10-CM | POA: Diagnosis not present

## 2021-06-24 DIAGNOSIS — J069 Acute upper respiratory infection, unspecified: Secondary | ICD-10-CM | POA: Diagnosis not present

## 2021-06-24 DIAGNOSIS — F320 Major depressive disorder, single episode, mild: Secondary | ICD-10-CM

## 2021-06-24 DIAGNOSIS — F411 Generalized anxiety disorder: Secondary | ICD-10-CM

## 2021-06-24 MED ORDER — ACETAMINOPHEN 167 MG/5ML PO LIQD: 15.0000 mL | ORAL | 0 refills | Status: DC | PRN

## 2021-06-24 MED ORDER — FLUTICASONE PROPIONATE 50 MCG/ACT NA SUSP: 2.0000 | Freq: Every day | NASAL | 6 refills | Status: DC

## 2021-06-24 MED ORDER — CETIRIZINE HCL 10 MG PO TABS: 10.0000 mg | ORAL_TABLET | Freq: Every day | ORAL | 11 refills | Status: DC

## 2021-06-24 MED ORDER — TIRZEPATIDE 10 MG/0.5ML ~~LOC~~ SOAJ: 10.0000 mg | SUBCUTANEOUS | 0 refills | Status: DC

## 2021-06-24 MED ORDER — GUAIFENESIN ER 600 MG PO TB12: 600.0000 mg | ORAL_TABLET | Freq: Two times a day (BID) | ORAL | 0 refills | Status: AC

## 2021-06-24 MED ORDER — METHYLPREDNISOLONE ACETATE 40 MG/ML IJ SUSP
40.0000 mg | Freq: Once | INTRAMUSCULAR | Status: AC
  Administered 2021-06-24: 40 mg via INTRAMUSCULAR

## 2021-06-24 MED ORDER — METHYLPREDNISOLONE SODIUM SUCC 40 MG IJ SOLR: 40.0000 mg | Freq: Once | INTRAMUSCULAR | Status: DC

## 2021-06-24 NOTE — Progress Notes (Addendum)
Subjective:  Patient ID: Rebecca [name], female    DOB: [birth year], 51 y.o.   MRN: [id number]  Patient Care Team: Baruch Gouty, FNP as PCP - General (Family Medicine)   Chief Complaint:  Obesity, Ear Pain, and Shoulder Pain   HPI: Rebecca [name] is a 51 y.o. female presenting on 06/24/2021 for Obesity, Ear Pain, and Shoulder Pain  Patient presents for weight check. She has been taking mounjaro and has had a 10lb weight loss in 2 months. She denies side effects or adherence issues.  She also complaints of a head cold with symptoms including sinus pressure, bilateral ear pain, headache, and sore throat. Symptoms began in the last 24 hours. She has used Zycam and Theraflu with no relief.  She also complains of shoulder pain since August. She states it is sharp in quality. She uses heat application in the morning, alternating with ice, stretching, and NSAID therapy daily for the past 6 months.   Shoulder Pain  Pertinent negatives include no fever.      Relevant past medical, surgical, family, and social history reviewed and updated as indicated.  Allergies and medications reviewed and updated. Data reviewed: Chart in Epic.   Past Medical History:  Diagnosis Date   Anxiety    Hyperlipidemia    Insomnia     Past Surgical History:  Procedure Laterality Date   ABDOMINAL HYSTERECTOMY     BREAST EXCISIONAL BIOPSY Left    TONSILLECTOMY Bilateral     Social History   Socioeconomic History   Marital status: Married    Spouse name: Not on file   Number of children: Not on file   Years of education: Not on file   Highest education level: Not on file  Occupational History   Occupation: [employer]  Tobacco Use   Smoking status: Never   Smokeless tobacco: Never  Vaping Use   Vaping Use: Never used  Substance and Sexual Activity   Alcohol use: Yes    Comment: 1-2 drinks per week   Drug use: Never   Sexual activity: Yes    Comment: total  hysterectomy  Other Topics Concern   Not on file  Social History Narrative   Not on file   Social Determinants of Health   Financial Resource Strain: Not on file  Food Insecurity: Not on file  Transportation Needs: Not on file  Physical Activity: Not on file  Stress: Not on file  Social Connections: Not on file  Intimate Partner Violence: Not on file    Outpatient Encounter Medications as of 06/24/2021  Medication Sig   Acetaminophen 167 MG/5ML LIQD Take 15 mLs (501 mg total) by mouth every 4 (four) hours as needed (Max dose 3061m daily).   atorvastatin (LIPITOR) 20 MG tablet Take 1 tablet (20 mg total) by mouth daily.   cetirizine (ZYRTEC) 10 MG tablet Take 1 tablet (10 mg total) by mouth daily.   doxepin (SINEQUAN) 25 MG capsule TAKE 1 CAPSULE BY MOUTH AT BEDTIME.   estradiol (ESTRACE) 0.5 MG tablet Take 1 tablet (0.5 mg total) by mouth daily.   FLUoxetine (PROZAC) 10 MG capsule TAKE 1 CAPSULE BY MOUTH EVERY DAY   fluticasone (FLONASE) 50 MCG/ACT nasal spray Place 2 sprays into both nostrils daily.   guaiFENesin (MUCINEX) 600 MG 12 hr tablet Take 1 tablet (600 mg total) by mouth 2 (two) times daily for 7 days.   MOUNJARO 12.5 MG/0.5ML Pen SMARTSIG:12.5 Milligram(s) SUB-Q Once a Week  omeprazole (PRILOSEC) 20 MG capsule TAKE 1 CAPSULE BY MOUTH EVERY DAY   Facility-Administered Encounter Medications as of 06/24/2021  Medication   methylPREDNISolone sodium succinate (SOLU-MEDROL) 40 mg/mL injection 40 mg    No Known Allergies  Review of Systems  Constitutional:  Negative for chills, diaphoresis and fever.  HENT:  Positive for ear pain, sinus pressure and sore throat. Negative for sinus pain.   Eyes:  Negative for pain and discharge.  Respiratory:  Negative for cough and shortness of breath.   Cardiovascular:  Negative for chest pain.  Musculoskeletal:  Positive for arthralgias.  Neurological:  Positive for headaches.  All other systems reviewed and are negative.       Objective:  BP 110/74    Pulse 97    Temp 98.2 F (36.8 C) (Temporal)    Ht 5' 6" (1.676 m)    Wt 91.2 kg    LMP 10/30/2018 (Approximate) Comment: total hysterectmy   BMI 32.44 kg/m    Wt Readings from Last 3 Encounters:  06/24/21 91.2 kg  04/28/21 96.7 kg  03/02/21 96.1 kg    Physical Exam Vitals and nursing note reviewed.  Constitutional:      Appearance: She is obese.  HENT:     Right Ear: A middle ear effusion is present. Tympanic membrane is not retracted or bulging.     Left Ear: A middle ear effusion is present. Tympanic membrane is not retracted or bulging.  Cardiovascular:     Rate and Rhythm: Normal rate and regular rhythm.  Pulmonary:     Effort: Pulmonary effort is normal.     Breath sounds: Normal breath sounds. No wheezing.  Musculoskeletal:     Right shoulder: Tenderness present. No swelling or crepitus. Decreased range of motion. Normal strength.     Left shoulder: Normal.  Neurological:     General: No focal deficit present.     Mental Status: She is alert and oriented to person, place, and time. Mental status is at baseline.  Psychiatric:        Mood and Affect: Mood normal.        Behavior: Behavior normal.        Thought Content: Thought content normal.    Results for orders placed or performed in visit on 03/02/21  CMP14+EGFR  Result Value Ref Range   Glucose 73 70 - 99 mg/dL   BUN 10 6 - 24 mg/dL   Creatinine, Ser 0.84 0.57 - 1.00 mg/dL   eGFR 86 >59 mL/min/1.73   BUN/Creatinine Ratio 12 9 - 23   Sodium 138 134 - 144 mmol/L   Potassium 4.2 3.5 - 5.2 mmol/L   Chloride 99 96 - 106 mmol/L   CO2 25 20 - 29 mmol/L   Calcium 10.8 (H) 8.7 - 10.2 mg/dL   Total Protein 7.6 6.0 - 8.5 g/dL   Albumin 4.7 3.8 - 4.8 g/dL   Globulin, Total 2.9 1.5 - 4.5 g/dL   Albumin/Globulin Ratio 1.6 1.2 - 2.2   Bilirubin Total 0.3 0.0 - 1.2 mg/dL   Alkaline Phosphatase 100 44 - 121 IU/L   AST 22 0 - 40 IU/L   ALT 42 (H) 0 - 32 IU/L  CBC with Differential/Platelet   Result Value Ref Range   WBC 10.2 3.4 - 10.8 x10E3/uL   RBC 4.79 3.77 - 5.28 x10E6/uL   Hemoglobin 14.9 11.1 - 15.9 g/dL   Hematocrit 43.3 34.0 - 46.6 %   MCV 90 79 - 97 fL  MCH 31.1 26.6 - 33.0 pg   MCHC 34.4 31.5 - 35.7 g/dL   RDW 12.9 11.7 - 15.4 %   Platelets 320 150 - 450 x10E3/uL   Neutrophils 58 Not Estab. %   Lymphs 34 Not Estab. %   Monocytes 7 Not Estab. %   Eos 0 Not Estab. %   Basos 1 Not Estab. %   Neutrophils Absolute 5.9 1.4 - 7.0 x10E3/uL   Lymphocytes Absolute 3.5 (H) 0.7 - 3.1 x10E3/uL   Monocytes Absolute 0.7 0.1 - 0.9 x10E3/uL   EOS (ABSOLUTE) 0.0 0.0 - 0.4 x10E3/uL   Basophils Absolute 0.1 0.0 - 0.2 x10E3/uL   Immature Granulocytes 0 Not Estab. %   Immature Grans (Abs) 0.0 0.0 - 0.1 x10E3/uL       Pertinent labs & imaging results that were available during my care of the patient were reviewed by me and considered in my medical decision making.  Assessment & Plan:  Rebecca [name] was seen today for obesity, ear pain and shoulder pain.  BMI 35.0-35.9  Improving with Mounjaro, will increase from 7.10m to 151m Chronic right shoulder pain -     DG Shoulder Right; Future. Patient has failed NSAID therapy over the past 6 months. X ray in office reveals   - Solumedrol 40 mg burst today for anti-inflammatory benefits. Will reevaluate if no improvement noted. Discussed referral to PT if needed   Viral upper respiratory illness -     fluticasone (FLONASE) 50 MCG/ACT nasal spray; Place 2 sprays into both nostrils daily. -     cetirizine (ZYRTEC) 10 MG tablet; Take 1 tablet (10 mg total) by mouth daily. -     Acetaminophen 167 MG/5ML LIQD; Take 15 mLs (501 mg total) by mouth every 4 (four) hours as needed (Max dose 300049maily). -     guaiFENesin (MUCINEX) 600 MG 12 hr tablet; Take 1 tablet (600 mg total) by mouth 2 (two) times daily for 7 days.       - Solumedrol 40 mg in office today.  - Will burst with steroids today.  - Denies fever, chills, fatigue, diaphoresis.  Symptoms indicate viral origin. Will treat with symptomatic care including warm fluids, increased hydration, throat lozenges, and humidification.     Continue all other maintenance medications.  Follow up plan: Return in about 6 weeks (around 08/05/2021), or if symptoms worsen or fail to improve, for BMI.   Continue healthy lifestyle choices, including diet (rich in fruits, vegetables, and lean proteins, and low in salt and simple carbohydrates) and exercise (at least 30 minutes of moderate physical activity daily).    The above assessment and management plan was discussed with the patient. The patient verbalized understanding of and has agreed to the management plan. Patient is aware to call the clinic if they develop any new symptoms or if symptoms persist or worsen. Patient is aware when to return to the clinic for a follow-up visit. Patient educated on when it is appropriate to go to the emergency department.   Addison Pugh, NP-S  I personally was present during the history, physical exam, and medical decision-making activities of this visit and have verified that the services and findings are accurately documented in the nurse practitioner student's note.  MicMonia PouchNP-C [facility] [address]Tokso[address] BayC [phone]

## 2021-06-25 ENCOUNTER — Other Ambulatory Visit: Payer: BC Managed Care – PPO

## 2021-06-25 ENCOUNTER — Encounter: Payer: Self-pay | Admitting: Family Medicine

## 2021-06-25 MED ORDER — FLUOXETINE HCL 10 MG PO CAPS: 10.0000 mg | ORAL_CAPSULE | Freq: Every day | ORAL | 0 refills | Status: DC

## 2021-07-26 ENCOUNTER — Encounter: Payer: Self-pay | Admitting: Family Medicine

## 2021-07-27 ENCOUNTER — Other Ambulatory Visit: Payer: Self-pay | Admitting: Family Medicine

## 2021-07-27 DIAGNOSIS — Z789 Other specified health status: Secondary | ICD-10-CM

## 2021-07-27 DIAGNOSIS — Z6835 Body mass index (BMI) 35.0-35.9, adult: Secondary | ICD-10-CM

## 2021-07-27 DIAGNOSIS — G8929 Other chronic pain: Secondary | ICD-10-CM

## 2021-08-03 ENCOUNTER — Ambulatory Visit: Payer: BC Managed Care – PPO

## 2021-08-10 ENCOUNTER — Ambulatory Visit: Payer: BC Managed Care – PPO | Admitting: Family Medicine

## 2021-08-15 ENCOUNTER — Other Ambulatory Visit: Payer: Self-pay | Admitting: Family Medicine

## 2021-08-15 DIAGNOSIS — K219 Gastro-esophageal reflux disease without esophagitis: Secondary | ICD-10-CM

## 2021-08-15 DIAGNOSIS — F411 Generalized anxiety disorder: Secondary | ICD-10-CM

## 2021-08-15 DIAGNOSIS — F5101 Primary insomnia: Secondary | ICD-10-CM

## 2021-08-15 DIAGNOSIS — F320 Major depressive disorder, single episode, mild: Secondary | ICD-10-CM

## 2021-08-20 ENCOUNTER — Encounter: Payer: Self-pay | Admitting: Family Medicine

## 2021-08-20 ENCOUNTER — Ambulatory Visit: Payer: BC Managed Care – PPO | Admitting: Family Medicine

## 2021-08-20 VITALS — BP 108/72 | HR 87 | Temp 98.5°F | Ht 66.0 in | Wt 198.0 lb

## 2021-08-20 DIAGNOSIS — G8929 Other chronic pain: Secondary | ICD-10-CM

## 2021-08-20 DIAGNOSIS — M25511 Pain in right shoulder: Secondary | ICD-10-CM | POA: Diagnosis not present

## 2021-08-20 DIAGNOSIS — Z6835 Body mass index (BMI) 35.0-35.9, adult: Secondary | ICD-10-CM

## 2021-08-20 DIAGNOSIS — R635 Abnormal weight gain: Secondary | ICD-10-CM | POA: Diagnosis not present

## 2021-08-20 DIAGNOSIS — Z7689 Persons encountering health services in other specified circumstances: Secondary | ICD-10-CM

## 2021-08-20 MED ORDER — METHYLPREDNISOLONE ACETATE 40 MG/ML IJ SUSP
60.0000 mg | Freq: Once | INTRAMUSCULAR | Status: AC
  Administered 2021-08-20: 60 mg via INTRA_ARTICULAR

## 2021-08-20 MED ORDER — TIRZEPATIDE 15 MG/0.5ML ~~LOC~~ SOAJ: 15.0000 mg | SUBCUTANEOUS | 1 refills | Status: DC

## 2021-08-20 NOTE — Progress Notes (Signed)
?  ? ?Subjective:  ?Patient ID: Rebecca [name], female    DOB: [birth year], 51 y.o.   MRN: [id number] ? ?Patient Care Team: ?Sonny Masters, FNP as PCP - General (Family Medicine)  ? ?Chief Complaint:  Weight Check and Shoulder Pain ? ? ?HPI: ?Rebecca [name] is a 51 y.o. female presenting on 08/20/2021 for Weight Check and Shoulder Pain ? ? ?Pt presents today for weight management. She has been on [facility] and doing well. She has adjusted diet and is more active on a daily basis. She denies adverse side effects from medication.  ?She also reports ongoing right shoulder pain despite physical therapy. Imaging was unremarkable. Systemic steroids were only beneficial for a few days. She has not had a intraarticular injection.  ? ?Shoulder Pain  ?The pain is present in the right shoulder. The current episode started more than 1 month ago. The problem occurs constantly. The problem has been waxing and waning. The quality of the pain is described as aching, burning and sharp. Associated symptoms include a limited range of motion and stiffness. Pertinent negatives include no fever, inability to bear weight, itching, joint locking, joint swelling, numbness or tingling. The symptoms are aggravated by activity. She has tried NSAIDS, heat, cold and movement (PT) for the symptoms. The treatment provided mild relief.  ? ? ?Relevant past medical, surgical, family, and social history reviewed and updated as indicated.  ?Allergies and medications reviewed and updated. Data reviewed: Chart in Epic. ? ? ?Past Medical History:  ?Diagnosis Date  ? Anxiety   ? Hyperlipidemia   ? Insomnia   ? ? ?Past Surgical History:  ?Procedure Laterality Date  ? ABDOMINAL HYSTERECTOMY    ? BREAST EXCISIONAL BIOPSY Left   ? TONSILLECTOMY Bilateral   ? ? ?Social History  ? ?Socioeconomic History  ? Marital status: Married  ?  Spouse name: Not on file  ? Number of children: Not on file  ? Years of education: Not on file  ? Highest education  level: Not on file  ?Occupational History  ? Occupation: [employer]  ?Tobacco Use  ? Smoking status: Never  ? Smokeless tobacco: Never  ?Vaping Use  ? Vaping Use: Never used  ?Substance and Sexual Activity  ? Alcohol use: Yes  ?  Comment: 1-2 drinks per week  ? Drug use: Never  ? Sexual activity: Yes  ?  Comment: total hysterectomy  ?Other Topics Concern  ? Not on file  ?Social History Narrative  ? Not on file  ? ?Social Determinants of Health  ? ?Financial Resource Strain: Not on file  ?Food Insecurity: Not on file  ?Transportation Needs: Not on file  ?Physical Activity: Not on file  ?Stress: Not on file  ?Social Connections: Not on file  ?Intimate Partner Violence: Not on file  ? ? ?Outpatient Encounter Medications as of 08/20/2021  ?Medication Sig  ? Acetaminophen 167 MG/5ML LIQD Take 15 mLs (501 mg total) by mouth every 4 (four) hours as needed (Max dose 3000mg  daily).  ? atorvastatin (LIPITOR) 20 MG tablet Take 1 tablet (20 mg total) by mouth daily.  ? cetirizine (ZYRTEC) 10 MG tablet Take 1 tablet (10 mg total) by mouth daily.  ? doxepin (SINEQUAN) 25 MG capsule TAKE 1 CAPSULE BY MOUTH AT BEDTIME.  ? estradiol (ESTRACE) 0.5 MG tablet Take 1 tablet (0.5 mg total) by mouth daily.  ? FLUoxetine (PROZAC) 10 MG capsule TAKE 1 CAPSULE BY MOUTH EVERY DAY  ? fluticasone (FLONASE) 50 MCG/ACT  nasal spray Place 2 sprays into both nostrils daily.  ? omeprazole (PRILOSEC) 20 MG capsule TAKE 1 CAPSULE BY MOUTH EVERY DAY  ? tirzepatide (MOUNJARO) 15 MG/0.5ML Pen Inject 15 mg into the skin once a week.  ? [DISCONTINUED] MOUNJARO 12.5 MG/0.5ML Pen INJECT 12.5 MG INTO THE SKIN ONCE A WEEK FOR 4 DOSES.  ? [DISCONTINUED] tirzepatide (MOUNJARO) 10 MG/0.5ML Pen Inject 10 mg into the skin once a week.  ? [EXPIRED] methylPREDNISolone acetate (DEPO-MEDROL) injection 60 mg   ? ?No facility-administered encounter medications on file as of 08/20/2021.  ? ? ?No Known Allergies ? ?Review of Systems  ?Constitutional:  Negative  for activity change, appetite change, chills, diaphoresis, fatigue, fever and unexpected weight change.  ?HENT: Negative.    ?Eyes: Negative.  Negative for photophobia and visual disturbance.  ?Respiratory:  Negative for cough, chest tightness and shortness of breath.   ?Cardiovascular:  Negative for chest pain, palpitations and leg swelling.  ?Gastrointestinal:  Negative for abdominal pain, blood in stool, constipation, diarrhea, nausea and vomiting.  ?Endocrine: Negative.   ?Genitourinary:  Negative for decreased urine volume, difficulty urinating, dysuria, frequency and urgency.  ?Musculoskeletal:  Positive for arthralgias and stiffness. Negative for back pain, gait problem, joint swelling, myalgias, neck pain and neck stiffness.  ?Skin: Negative.  Negative for itching.  ?Allergic/Immunologic: Negative.   ?Neurological:  Negative for dizziness, tingling, weakness, numbness and headaches.  ?Hematological: Negative.   ?Psychiatric/Behavioral:  Negative for confusion, hallucinations, sleep disturbance and suicidal ideas.   ?All other systems reviewed and are negative. ? ?   ? ?Objective:  ?BP 108/72   Pulse 87   Temp 98.5 ?F (36.9 ?C)   Ht 5\' 6"  (1.676 m)   Wt 198 lb (89.8 kg)   LMP 10/30/2018 (Approximate) Comment: total hysterectmy  SpO2 94%   BMI 31.96 kg/m?   ? ?Wt Readings from Last 3 Encounters:  ?08/20/21 198 lb (89.8 kg)  ?06/24/21 201 lb (91.2 kg)  ?04/28/21 213 lb 4 oz (96.7 kg)  ? ? ?Physical Exam ?Vitals and nursing note reviewed.  ?Constitutional:   ?   General: She is not in acute distress. ?   Appearance: Normal appearance. She is well-developed and well-groomed. She is obese. She is not ill-appearing, toxic-appearing or diaphoretic.  ?HENT:  ?   Head: Normocephalic and atraumatic.  ?   Jaw: There is normal jaw occlusion.  ?   Right Ear: Hearing normal.  ?   Left Ear: Hearing normal.  ?   Nose: Nose normal.  ?   Mouth/Throat:  ?   Lips: Pink.  ?   Mouth: Mucous membranes are moist.  ?    Pharynx: Oropharynx is clear. Uvula midline.  ?Eyes:  ?   General: Lids are normal.  ?   Extraocular Movements: Extraocular movements intact.  ?   Conjunctiva/sclera: Conjunctivae normal.  ?   Pupils: Pupils are equal, round, and reactive to light.  ?Neck:  ?   Thyroid: No thyroid mass, thyromegaly or thyroid tenderness.  ?   Vascular: No carotid bruit or JVD.  ?   Trachea: Trachea and phonation normal.  ?Cardiovascular:  ?   Rate and Rhythm: Normal rate and regular rhythm.  ?   Chest Wall: PMI is not displaced.  ?   Pulses: Normal pulses.  ?   Heart sounds: Normal heart sounds. No murmur heard. ?  No friction rub. No gallop.  ?Pulmonary:  ?   Effort: Pulmonary effort is normal. No respiratory distress.  ?  Breath sounds: Normal breath sounds. No wheezing.  ?Abdominal:  ?   General: Bowel sounds are normal. There is no distension or abdominal bruit.  ?   Palpations: Abdomen is soft. There is no hepatomegaly or splenomegaly.  ?   Tenderness: There is no abdominal tenderness. There is no right CVA tenderness or left CVA tenderness.  ?   Hernia: No hernia is present.  ?Musculoskeletal:  ?   Right shoulder: Tenderness present. No swelling, deformity, effusion, laceration, bony tenderness or crepitus. Decreased range of motion. Normal strength. Normal pulse.  ?   Left shoulder: Normal.  ?   Right upper arm: Normal.  ?   Cervical back: Normal, normal range of motion and neck supple.  ?   Right lower leg: No edema.  ?   Left lower leg: No edema.  ?Lymphadenopathy:  ?   Cervical: No cervical adenopathy.  ?Skin: ?   General: Skin is warm and dry.  ?   Capillary Refill: Capillary refill takes less than 2 seconds.  ?   Coloration: Skin is not cyanotic, jaundiced or pale.  ?   Findings: No rash.  ?Neurological:  ?   General: No focal deficit present.  ?   Mental Status: She is alert and oriented to person, place, and time.  ?   Sensory: Sensation is intact.  ?   Motor: Motor function is intact.  ?   Coordination: Coordination  is intact.  ?   Gait: Gait is intact.  ?   Deep Tendon Reflexes: Reflexes are normal and symmetric.  ?Psychiatric:     ?   Attention and Perception: Attention and perception normal.     ?   Mood and Affect: Mood and aff

## 2021-08-20 NOTE — Patient Instructions (Signed)
Here is a guide to help [place] find out which weight loss medications will be covered by your insurance plan.  ?Please check out this web site  [email or web address] and follow the 3 simple steps.  ? ?There is also a phone number you can call if you do not have access to the Internet. [phone] (Monday- Friday 8am-8pm) ? ?[employer] provides coverage information for more than 80% of the inquiries submitted!!  ? ?Wegovy or Saxenda ?

## 2021-08-27 ENCOUNTER — Encounter: Payer: Self-pay | Admitting: Family Medicine

## 2021-08-27 ENCOUNTER — Telehealth: Payer: Self-pay | Admitting: Family Medicine

## 2021-08-27 ENCOUNTER — Other Ambulatory Visit: Payer: Self-pay | Admitting: Family Medicine

## 2021-08-27 DIAGNOSIS — Z7689 Persons encountering health services in other specified circumstances: Secondary | ICD-10-CM

## 2021-08-27 DIAGNOSIS — Z6835 Body mass index (BMI) 35.0-35.9, adult: Secondary | ICD-10-CM

## 2021-08-27 MED ORDER — SEMAGLUTIDE-WEIGHT MANAGEMENT 1 MG/0.5ML ~~LOC~~ SOAJ: 1.0000 mg | SUBCUTANEOUS | 0 refills | Status: DC

## 2021-08-27 MED ORDER — SEMAGLUTIDE-WEIGHT MANAGEMENT 2.4 MG/0.75ML ~~LOC~~ SOAJ: 2.4000 mg | SUBCUTANEOUS | 0 refills | Status: DC

## 2021-08-27 MED ORDER — SEMAGLUTIDE-WEIGHT MANAGEMENT 0.25 MG/0.5ML ~~LOC~~ SOAJ: 0.2500 mg | SUBCUTANEOUS | 0 refills | Status: AC

## 2021-08-27 MED ORDER — SEMAGLUTIDE-WEIGHT MANAGEMENT 0.5 MG/0.5ML ~~LOC~~ SOAJ: 0.5000 mg | SUBCUTANEOUS | 0 refills | Status: DC

## 2021-08-27 MED ORDER — SEMAGLUTIDE-WEIGHT MANAGEMENT 1.7 MG/0.75ML ~~LOC~~ SOAJ: 1.7000 mg | SUBCUTANEOUS | 0 refills | Status: DC

## 2021-08-27 NOTE — Telephone Encounter (Signed)
Will need order  ?

## 2021-08-27 NOTE — Telephone Encounter (Signed)
Insurance covers [place]. Needs Prior Auth started and patient would prefer Wegovy. They will be faxing over request.  ?

## 2021-08-30 ENCOUNTER — Telehealth: Payer: Self-pay | Admitting: *Deleted

## 2021-08-30 NOTE — Telephone Encounter (Signed)
Marguetta NATTERSTAD-SMITH (Key: [id number]) ?Rx #: [id number] ?Wegovy 0.25MG /0.5ML auto-injectors ? ?Sent to plan ?

## 2021-08-30 NOTE — Telephone Encounter (Signed)
APPROVED ? ?Faxed approval to patients pharmacy ?

## 2021-09-01 ENCOUNTER — Telehealth: Payer: Self-pay | Admitting: Family Medicine

## 2021-09-06 NOTE — Telephone Encounter (Signed)
Rebecca [name] calling back to because she still needs the information from the form that was faxed to [place]. She is aware that the medication was approved and sent to pharmacy. Please call back with information.   ?

## 2021-09-09 ENCOUNTER — Encounter: Payer: Self-pay | Admitting: Family Medicine

## 2021-09-10 ENCOUNTER — Other Ambulatory Visit: Payer: Self-pay | Admitting: Family Medicine

## 2021-09-10 DIAGNOSIS — G8929 Other chronic pain: Secondary | ICD-10-CM | POA: Insufficient documentation

## 2021-09-14 ENCOUNTER — Encounter: Payer: Self-pay | Admitting: Family Medicine

## 2021-09-30 ENCOUNTER — Encounter: Payer: Self-pay | Admitting: Family Medicine

## 2021-10-07 ENCOUNTER — Ambulatory Visit: Payer: BC Managed Care – PPO | Admitting: Orthopedic Surgery

## 2021-10-07 DIAGNOSIS — M25511 Pain in right shoulder: Secondary | ICD-10-CM | POA: Diagnosis not present

## 2021-10-08 ENCOUNTER — Encounter: Payer: Self-pay | Admitting: Orthopedic Surgery

## 2021-10-08 ENCOUNTER — Encounter: Payer: Self-pay | Admitting: Family Medicine

## 2021-10-08 ENCOUNTER — Ambulatory Visit: Payer: BC Managed Care – PPO | Admitting: Family Medicine

## 2021-10-08 VITALS — BP 107/72 | HR 71 | Temp 98.7°F | Ht 65.0 in | Wt 199.0 lb

## 2021-10-08 DIAGNOSIS — F411 Generalized anxiety disorder: Secondary | ICD-10-CM | POA: Diagnosis not present

## 2021-10-08 DIAGNOSIS — Z7689 Persons encountering health services in other specified circumstances: Secondary | ICD-10-CM | POA: Diagnosis not present

## 2021-10-08 DIAGNOSIS — Z6835 Body mass index (BMI) 35.0-35.9, adult: Secondary | ICD-10-CM | POA: Diagnosis not present

## 2021-10-08 DIAGNOSIS — E782 Mixed hyperlipidemia: Secondary | ICD-10-CM

## 2021-10-08 DIAGNOSIS — G8929 Other chronic pain: Secondary | ICD-10-CM

## 2021-10-08 DIAGNOSIS — F320 Major depressive disorder, single episode, mild: Secondary | ICD-10-CM | POA: Diagnosis not present

## 2021-10-08 DIAGNOSIS — M25511 Pain in right shoulder: Secondary | ICD-10-CM

## 2021-10-08 MED ORDER — WEGOVY 2.4 MG/0.75ML ~~LOC~~ SOAJ: 2.4000 mg | SUBCUTANEOUS | 3 refills | Status: DC

## 2021-10-08 NOTE — Progress Notes (Signed)
Subjective:  Patient ID: Rebecca [name], female    DOB: [birth year], 51 y.o.   MRN: [id number]  Patient Care Team: Baruch Gouty, FNP as PCP - General (Family Medicine)   Chief Complaint:  Medical Management of Chronic Issues   HPI: Rebecca [name] is a 51 y.o. female presenting on 10/08/2021 for Medical Management of Chronic Issues   1. BMI 35.0-35.9,adult 2. Encounter for weight management Pt has been on Mounjaro and tolerating well. Insurance will no longer cover this medication. Will switch to [facility] and start at highest dose as pt has been on Mounjaro without associated side effects. Initial weight 12/2020 215 lb, weight today 199 lb.   3. Mixed hyperlipidemia Has been taking statin therapy without myalgias. Has been dieting and exercising. Fasting today so labs can be checked.   4. Chronic right shoulder pain Ongoing symptoms despite joint injection and PT. Was seen by ortho and is scheduled to have a MRI on Monday.   5. Depression, major, single episode, mild ([place]) 6. GAD (generalized anxiety disorder) Has been doing very well and would like to stop SSRI therapy.    10/08/2021    8:11 AM 08/20/2021    3:55 PM 04/28/2021    8:06 AM 03/02/2021    4:00 PM  GAD 7 : Generalized Anxiety Score  Nervous, Anxious, on Edge 0 0 1 2  Control/stop worrying 0 0 1 2  Worry too much - different things 0 0 1 2  Trouble relaxing 0 0 1 2  Restless 0 0 1 2  Easily annoyed or irritable 0 0 1 2  Afraid - awful might happen 0 0 0 1  Total GAD 7 Score 0 0 6 13  Anxiety Difficulty Not difficult at all Not difficult at all Somewhat difficult Somewhat difficult       10/08/2021    8:11 AM 08/20/2021    3:54 PM 06/24/2021    3:12 PM 04/28/2021    8:06 AM 03/02/2021    3:59 PM  Depression screen PHQ 2/9  Decreased Interest 0 0 0 1 0  Down, Depressed, Hopeless 0 0 0 1 0  PHQ - 2 Score 0 0 0 2 0  Altered sleeping 0 _0 Tired, decreased energy _1 0  Change in appetite 0 0   1 0  Feeling bad or failure about yourself  0 0  1 0  Trouble concentrating 0 0  3 1  Moving slowly or fidgety/restless 0 0  0 0  Suicidal thoughts 0 0  0 0  PHQ-9 Score _2 Difficult doing work/chores Not difficult at all Not difficult at all  Somewhat difficult Somewhat difficult        Relevant past medical, surgical, family, and social history reviewed and updated as indicated.  Allergies and medications reviewed and updated. Data reviewed: Chart in Epic.   Past Medical History:  Diagnosis Date   Anxiety    Hyperlipidemia    Insomnia     Past Surgical History:  Procedure Laterality Date   ABDOMINAL HYSTERECTOMY     BREAST EXCISIONAL BIOPSY Left    TONSILLECTOMY Bilateral     Social History   Socioeconomic History   Marital status: Married    Spouse name: Not on file   Number of children: Not on file   Years of education: Not on file   Highest education level: Not on file  Occupational  History   Occupation: [employer]  Tobacco Use   Smoking status: Never   Smokeless tobacco: Never  Vaping Use   Vaping Use: Never used  Substance and Sexual Activity   Alcohol use: Yes    Comment: 1-2 drinks per week   Drug use: Never   Sexual activity: Yes    Comment: total hysterectomy  Other Topics Concern   Not on file  Social History Narrative   Not on file   Social Determinants of Health   Financial Resource Strain: Not on file  Food Insecurity: Not on file  Transportation Needs: Not on file  Physical Activity: Not on file  Stress: Not on file  Social Connections: Not on file  Intimate Partner Violence: Not on file    Outpatient Encounter Medications as of 10/08/2021  Medication Sig   Semaglutide-Weight Management (WEGOVY) 2.4 MG/0.75ML SOAJ Inject 2.4 mg into the skin once a week.   atorvastatin (LIPITOR) 20 MG tablet Take 1 tablet (20 mg total) by mouth daily.   cetirizine (ZYRTEC) 10 MG tablet Take 1 tablet (10 mg total) by mouth  daily.   doxepin (SINEQUAN) 25 MG capsule TAKE 1 CAPSULE BY MOUTH AT BEDTIME.   estradiol (ESTRACE) 0.5 MG tablet Take 1 tablet (0.5 mg total) by mouth daily.   FLUoxetine (PROZAC) 10 MG capsule TAKE 1 CAPSULE BY MOUTH EVERY DAY   fluticasone (FLONASE) 50 MCG/ACT nasal spray Place 2 sprays into both nostrils daily.   omeprazole (PRILOSEC) 20 MG capsule TAKE 1 CAPSULE BY MOUTH EVERY DAY   [DISCONTINUED] Acetaminophen 167 MG/5ML LIQD Take 15 mLs (501 mg total) by mouth every 4 (four) hours as needed (Max dose 3081m daily).   [DISCONTINUED] Semaglutide-Weight Management 0.5 MG/0.5ML SOAJ Inject 0.5 mg into the skin once a week for 28 days. (Patient not taking: Reported on 10/08/2021)   [DISCONTINUED] Semaglutide-Weight Management 1 MG/0.5ML SOAJ Inject 1 mg into the skin once a week for 28 days. (Patient not taking: Reported on 10/08/2021)   [DISCONTINUED] Semaglutide-Weight Management 1.7 MG/0.75ML SOAJ Inject 1.7 mg into the skin once a week for 28 days. (Patient not taking: Reported on 10/08/2021)   [DISCONTINUED] Semaglutide-Weight Management 2.4 MG/0.75ML SOAJ Inject 2.4 mg into the skin once a week for 28 days. (Patient not taking: Reported on 10/08/2021)   [DISCONTINUED] tirzepatide (MOUNJARO) 15 MG/0.5ML Pen Inject 15 mg into the skin once a week. (Patient not taking: Reported on 10/08/2021)   No facility-administered encounter medications on file as of 10/08/2021.    No Known Allergies  Review of Systems  Constitutional:  Positive for fatigue. Negative for activity change, appetite change, chills, diaphoresis, fever and unexpected weight change.  HENT: Negative.    Eyes: Negative.  Negative for photophobia and visual disturbance.  Respiratory:  Negative for cough, chest tightness and shortness of breath.   Cardiovascular:  Negative for chest pain, palpitations and leg swelling.  Gastrointestinal:  Negative for abdominal pain, blood in stool, constipation, diarrhea, nausea and vomiting.   Endocrine: Negative.   Genitourinary:  Negative for decreased urine volume, difficulty urinating, dysuria, frequency and urgency.  Musculoskeletal:  Positive for arthralgias. Negative for myalgias.  Skin: Negative.   Allergic/Immunologic: Negative.   Neurological:  Negative for dizziness, tremors, seizures, syncope, facial asymmetry, speech difficulty, weakness, light-headedness, numbness and headaches.  Hematological: Negative.   Psychiatric/Behavioral:  Negative for confusion, hallucinations, sleep disturbance and suicidal ideas.   All other systems reviewed and are negative.       Objective:  BP  107/72   Pulse 71   Temp 98.7 F (37.1 C)   Ht _0  (1.651 m)   Wt 199 lb (90.3 kg)   LMP 10/30/2018 (Approximate) Comment: total hysterectmy  SpO2 97%   BMI 33.12 kg/m    Wt Readings from Last 3 Encounters:  10/08/21 199 lb (90.3 kg)  08/20/21 198 lb (89.8 kg)  06/24/21 201 lb (91.2 kg)    Physical Exam Vitals and nursing note reviewed.  Constitutional:      General: She is not in acute distress.    Appearance: Normal appearance. She is well-developed and well-groomed. She is obese. She is not ill-appearing, toxic-appearing or diaphoretic.  HENT:     Head: Normocephalic and atraumatic.     Jaw: There is normal jaw occlusion.     Right Ear: Hearing normal.     Left Ear: Hearing normal.     Nose: Nose normal.     Mouth/Throat:     Lips: Pink.     Mouth: Mucous membranes are moist.     Pharynx: Oropharynx is clear. Uvula midline.  Eyes:     General: Lids are normal.     Pupils: Pupils are equal, round, and reactive to light.  Neck:     Thyroid: No thyroid mass, thyromegaly or thyroid tenderness.     Vascular: No carotid bruit or JVD.     Trachea: Trachea and phonation normal.  Cardiovascular:     Rate and Rhythm: Normal rate and regular rhythm.     Chest Wall: PMI is not displaced.     Pulses: Normal pulses.     Heart sounds: Normal heart sounds. No murmur heard.     No friction rub. No gallop.  Pulmonary:     Effort: Pulmonary effort is normal. No respiratory distress.     Breath sounds: Normal breath sounds. No wheezing.  Abdominal:     General: Bowel sounds are normal. There is no abdominal bruit.     Palpations: Abdomen is soft. There is no hepatomegaly or splenomegaly.  Musculoskeletal:     Cervical back: Normal range of motion and neck supple.     Right lower leg: No edema.     Left lower leg: No edema.  Lymphadenopathy:     Cervical: No cervical adenopathy.  Skin:    General: Skin is warm and dry.     Capillary Refill: Capillary refill takes less than 2 seconds.     Coloration: Skin is not cyanotic, jaundiced or pale.     Findings: No rash.  Neurological:     General: No focal deficit present.     Mental Status: She is alert and oriented to person, place, and time.     Sensory: Sensation is intact.     Motor: Motor function is intact.     Coordination: Coordination is intact.     Gait: Gait is intact.     Deep Tendon Reflexes: Reflexes are normal and symmetric.  Psychiatric:        Attention and Perception: Attention and perception normal.        Mood and Affect: Mood and affect normal.        Speech: Speech normal.        Behavior: Behavior normal. Behavior is cooperative.        Thought Content: Thought content normal.        Cognition and Memory: Cognition and memory normal.        Judgment: Judgment normal.  Results for orders placed or performed in visit on 03/02/21  CMP14+EGFR  Result Value Ref Range   Glucose 73 70 - 99 mg/dL   BUN 10 6 - 24 mg/dL   Creatinine, Ser 0.84 0.57 - 1.00 mg/dL   eGFR 86 >59 mL/min/1.73   BUN/Creatinine Ratio 12 9 - 23   Sodium 138 134 - 144 mmol/L   Potassium 4.2 3.5 - 5.2 mmol/L   Chloride 99 96 - 106 mmol/L   CO2 25 20 - 29 mmol/L   Calcium 10.8 (H) 8.7 - 10.2 mg/dL   Total Protein 7.6 6.0 - 8.5 g/dL   Albumin 4.7 3.8 - 4.8 g/dL   Globulin, Total 2.9 1.5 - 4.5 g/dL    Albumin/Globulin Ratio 1.6 1.2 - 2.2   Bilirubin Total 0.3 0.0 - 1.2 mg/dL   Alkaline Phosphatase 100 44 - 121 IU/L   AST 22 0 - 40 IU/L   ALT 42 (H) 0 - 32 IU/L  CBC with Differential/Platelet  Result Value Ref Range   WBC 10.2 3.4 - 10.8 x10E3/uL   RBC 4.79 3.77 - 5.28 x10E6/uL   Hemoglobin 14.9 11.1 - 15.9 g/dL   Hematocrit 43.3 34.0 - 46.6 %   MCV 90 79 - 97 fL   MCH 31.1 26.6 - 33.0 pg   MCHC 34.4 31.5 - 35.7 g/dL   RDW 12.9 11.7 - 15.4 %   Platelets 320 150 - 450 x10E3/uL   Neutrophils 58 Not Estab. %   Lymphs 34 Not Estab. %   Monocytes 7 Not Estab. %   Eos 0 Not Estab. %   Basos 1 Not Estab. %   Neutrophils Absolute 5.9 1.4 - 7.0 x10E3/uL   Lymphocytes Absolute 3.5 (H) 0.7 - 3.1 x10E3/uL   Monocytes Absolute 0.7 0.1 - 0.9 x10E3/uL   EOS (ABSOLUTE) 0.0 0.0 - 0.4 x10E3/uL   Basophils Absolute 0.1 0.0 - 0.2 x10E3/uL   Immature Granulocytes 0 Not Estab. %   Immature Grans (Abs) 0.0 0.0 - 0.1 x10E3/uL       Pertinent labs & imaging results that were available during my care of the patient were reviewed by me and considered in my medical decision making.  Assessment & Plan:  Ethell was seen today for medical management of chronic issues.  Diagnoses and all orders for this visit:  BMI 35.0-35.9,adult Encounter for weight management Has not been on medications in 2 weeks as they have been out of stock. Will resend YEMVVK to pharmacy today. Continue diet and exercise. Pt would like to see bariatric medicine for possible weight loss surgery.  -     CBC with Differential/Platelet -     CMP14+EGFR -     Lipid panel -     TSH -     Semaglutide-Weight Management (WEGOVY) 2.4 MG/0.75ML SOAJ; Inject 2.4 mg into the skin once a week. -     Amb Referral to Bariatric Surgery  Mixed hyperlipidemia Diet encouraged - increase intake of fresh fruits and vegetables, increase intake of lean proteins. Bake, broil, or grill foods. Avoid fried, greasy, and fatty foods. Avoid fast foods.  Increase intake of fiber-rich whole grains. Exercise encouraged - at least 150 minutes per week and advance as tolerated. Goal BMI < 25. Continue medications as prescribed. Follow up in 3-6 months as discussed.  -     Lipid panel  Chronic right shoulder pain Followed by ortho and has a MRI scheduled on Monday.   Depression, major, single episode, mild (  [place]) GAD (generalized anxiety disorder) Has been doing great and would like to taper off of SSRI therapy. Discussed proper way to taper off SSRI therapy. Discussed withdrawal side effects in detail.  -     TSH    Continue all other maintenance medications.  Follow up plan: Return in about 3 months (around 01/08/2022), or if symptoms worsen or fail to improve, for BMI.   Continue healthy lifestyle choices, including diet (rich in fruits, vegetables, and lean proteins, and low in salt and simple carbohydrates) and exercise (at least 30 minutes of moderate physical activity daily).  Educational handout given for calorie counting for weight loss  The above assessment and management plan was discussed with the patient. The patient verbalized understanding of and has agreed to the management plan. Patient is aware to call the clinic if they develop any new symptoms or if symptoms persist or worsen. Patient is aware when to return to the clinic for a follow-up visit. Patient educated on when it is appropriate to go to the emergency department.   Monia Pouch, FNP-C [facility] [phone]

## 2021-10-08 NOTE — Progress Notes (Signed)
Office Visit Note   Patient: Rebecca [name]           Date of Birth: 07-13-1970           MRN: [id number] Visit Date: 10/07/2021 Requested by: Sonny Masters, FNP [address] [place],  [place] [address] PCP: Sonny Masters, FNP  Subjective: Chief Complaint  Patient presents with   Right Shoulder - Pain    HPI: Rebecca [name] is a 51 year old patient with right shoulder pain.  She has had pain since August 2022.  She just moved across country to live here in [place].  The pain started after she was lifting a lot of boxes associated with that move.  She has had 9 sessions of physical therapy without relief.  She has also had a subacromial steroid injection about 2 months ago which gave her minimal relief only for a few days.  She is right-hand dominant.  The pain wakes her from sleep at night.  She reports constant pain worse with activity.  Also reports shoulder stiffness with painful range of motion.  The pain radiates down the arm but not into the fingers.  Denies any numbness and tingling or neck pain.  Symptoms ongoing now for 6 months.  Radiographs are reviewed and are negative for fracture or significant arthritis.  She does report some mechanical symptoms.  She has tried a lot of medications and nothing has helped.  She works in a Tax inspector.  Most of her pain localizes to the deltoid region.              ROS: All systems reviewed are negative as they relate to the chief complaint within the history of present illness.  Patient denies  fevers or chills.   Assessment & Plan: Visit Diagnoses:  1. Right shoulder pain, unspecified chronicity     Plan: Impression is right shoulder pain which could be early frozen shoulder.  From a structural standpoint her rotator cuff strength is intact and I do not really feel too much in terms of coarse grinding or crepitus.  With failure of nonoperative treatment and slight restriction of passive range of motion MRI arthrogram indicated to  evaluate for intra-articular pathology.  Follow-up after that study.  Could consider intra-articular glenohumeral joint injection at that time.  Follow-Up Instructions: Return for after MRI.   Orders:  Orders Placed This Encounter  Procedures   MR SHOULDER RIGHT W CONTRAST   Arthrogram   No orders of the defined types were placed in this encounter.     Procedures: No procedures performed   Clinical Data: No additional findings.  Objective: Vital Signs: LMP 10/30/2018 (Approximate) Comment: total hysterectmy  Physical Exam:   Constitutional: Patient appears well-developed HEENT:  Head: Normocephalic Eyes:EOM are normal Neck: Normal range of motion Cardiovascular: Normal rate Pulmonary/chest: Effort normal Neurologic: Patient is alert Skin: Skin is warm Psychiatric: Patient has normal mood and affect   Ortho Exam: Ortho exam demonstrates 5 out of 5 grip EPL FPL interosseous resection extension bicep triceps and deltoid strength.  Neck range of motion is full flexion extension rotation.  No coarse grinding or crepitus with internal/external rotation of the right arm at 90 degrees of abduction.  Rotator cuff strength excellent infraspinatus supraspinatus and subscap muscle testing.  O'Brien's testing negative on the right and left-hand side equivocal speeds testing on the right.  Passive range of motion bilaterally is 65/95/165.  No masses lymphadenopathy or skin changes noted in that right shoulder  girdle region.  She does have slight restriction in pain with external rotation and forward flexion on the right compared to the left.  Specialty Comments:  No specialty comments available.  Imaging: No results found.   PMFS History: Patient Active Problem List   Diagnosis Date Noted   Chronic right shoulder pain 09/10/2021   Depression, major, single episode, mild (HCC) 04/28/2021   GAD (generalized anxiety disorder) 04/28/2021   Insomnia 01/14/2021   Mixed  hyperlipidemia 01/14/2021   BMI 35.0-35.9,adult 01/14/2021   History of total hysterectomy 01/14/2021   Elevated blood pressure reading without diagnosis of hypertension 01/14/2021   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   Past Medical History:  Diagnosis Date   Anxiety    Hyperlipidemia    Insomnia     Family History  Problem Relation Age of Onset   Depression Mother    Heart disease Mother    Diabetes Father    Hyperlipidemia Father    Cancer Maternal Grandfather    Diabetes Paternal Grandmother     Past Surgical History:  Procedure Laterality Date   ABDOMINAL HYSTERECTOMY     BREAST EXCISIONAL BIOPSY Left    TONSILLECTOMY Bilateral    Social History   Occupational History   Occupation: [employer]  Tobacco Use   Smoking status: Never   Smokeless tobacco: Never  Vaping Use   Vaping Use: Never used  Substance and Sexual Activity   Alcohol use: Yes    Comment: 1-2 drinks per week   Drug use: Never   Sexual activity: Yes    Comment: total hysterectomy

## 2021-10-09 LAB — TSH: TSH: 1.87 u[IU]/mL (ref 0.450–4.500)

## 2021-10-09 LAB — CBC WITH DIFFERENTIAL/PLATELET
Basophils Absolute: 0.1 10*3/uL (ref 0.0–0.2)
Basos: 1 %
EOS (ABSOLUTE): 0.1 10*3/uL (ref 0.0–0.4)
Eos: 2 %
Hematocrit: 42.8 % (ref 34.0–46.6)
Hemoglobin: 14.4 g/dL (ref 11.1–15.9)
Immature Grans (Abs): 0 10*3/uL (ref 0.0–0.1)
Immature Granulocytes: 0 %
Lymphocytes Absolute: 2.5 10*3/uL (ref 0.7–3.1)
Lymphs: 36 %
MCH: 31.4 pg (ref 26.6–33.0)
MCHC: 33.6 g/dL (ref 31.5–35.7)
MCV: 93 fL (ref 79–97)
Monocytes Absolute: 0.4 10*3/uL (ref 0.1–0.9)
Monocytes: 7 %
Neutrophils Absolute: 3.8 10*3/uL (ref 1.4–7.0)
Neutrophils: 54 %
Platelets: 290 10*3/uL (ref 150–450)
RBC: 4.59 x10E6/uL (ref 3.77–5.28)
RDW: 13.6 % (ref 11.7–15.4)
WBC: 6.8 10*3/uL (ref 3.4–10.8)

## 2021-10-09 LAB — CMP14+EGFR
ALT: 63 IU/L — ABNORMAL HIGH (ref 0–32)
AST: 27 IU/L (ref 0–40)
Albumin/Globulin Ratio: 2 (ref 1.2–2.2)
Albumin: 4.4 g/dL (ref 3.8–4.8)
Alkaline Phosphatase: 100 IU/L (ref 44–121)
BUN/Creatinine Ratio: 10 (ref 9–23)
BUN: 9 mg/dL (ref 6–24)
Bilirubin Total: 0.4 mg/dL (ref 0.0–1.2)
CO2: 24 mmol/L (ref 20–29)
Calcium: 9.3 mg/dL (ref 8.7–10.2)
Chloride: 103 mmol/L (ref 96–106)
Creatinine, Ser: 0.9 mg/dL (ref 0.57–1.00)
Globulin, Total: 2.2 g/dL (ref 1.5–4.5)
Glucose: 94 mg/dL (ref 70–99)
Potassium: 4.2 mmol/L (ref 3.5–5.2)
Sodium: 140 mmol/L (ref 134–144)
Total Protein: 6.6 g/dL (ref 6.0–8.5)
eGFR: 79 mL/min/{1.73_m2} (ref >59)

## 2021-10-09 LAB — LIPID PANEL
Chol/HDL Ratio: 4.1 ratio (ref 0.0–4.4)
Cholesterol, Total: 185 mg/dL (ref 100–199)
HDL: 45 mg/dL (ref >39)
LDL Chol Calc (NIH): 111 mg/dL — ABNORMAL HIGH (ref 0–99)
Triglycerides: 164 mg/dL — ABNORMAL HIGH (ref 0–149)
VLDL Cholesterol Cal: 29 mg/dL (ref 5–40)

## 2021-10-11 ENCOUNTER — Encounter: Payer: Self-pay | Admitting: Family Medicine

## 2021-10-11 ENCOUNTER — Ambulatory Visit
Admission: RE | Admit: 2021-10-11 | Discharge: 2021-10-11 | Disposition: A | Discharge status: Home / Self Care | Payer: BC Managed Care – PPO | Source: Ambulatory Visit | Attending: Orthopedic Surgery | Admitting: Orthopedic Surgery

## 2021-10-11 DIAGNOSIS — M25511 Pain in right shoulder: Secondary | ICD-10-CM

## 2021-10-11 MED ORDER — IOPAMIDOL (ISOVUE-M 200) INJECTION 41%
15.0000 mL | Freq: Once | INTRAMUSCULAR | Status: AC
  Administered 2021-10-11: 15 mL via INTRA_ARTICULAR

## 2021-10-12 ENCOUNTER — Encounter: Payer: Self-pay | Admitting: Family Medicine

## 2021-10-13 ENCOUNTER — Ambulatory Visit: Payer: BC Managed Care – PPO | Admitting: Surgical

## 2021-10-13 ENCOUNTER — Ambulatory Visit: Payer: Self-pay

## 2021-10-13 ENCOUNTER — Encounter: Payer: Self-pay | Admitting: Surgical

## 2021-10-13 DIAGNOSIS — M75111 Incomplete rotator cuff tear or rupture of right shoulder, not specified as traumatic: Secondary | ICD-10-CM | POA: Diagnosis not present

## 2021-10-13 DIAGNOSIS — M25511 Pain in right shoulder: Secondary | ICD-10-CM

## 2021-10-13 DIAGNOSIS — S46101A Unspecified injury of muscle, fascia and tendon of long head of biceps, right arm, initial encounter: Secondary | ICD-10-CM

## 2021-10-13 DIAGNOSIS — M19011 Primary osteoarthritis, right shoulder: Secondary | ICD-10-CM | POA: Diagnosis not present

## 2021-10-13 MED ORDER — LIDOCAINE HCL 1 % IJ SOLN
5.0000 mL | INTRAMUSCULAR | Status: AC | PRN
  Administered 2021-10-13: 5 mL

## 2021-10-13 MED ORDER — METHYLPREDNISOLONE ACETATE 40 MG/ML IJ SUSP
40.0000 mg | INTRAMUSCULAR | Status: AC | PRN
  Administered 2021-10-13: 40 mg via INTRA_ARTICULAR

## 2021-10-13 MED ORDER — BUPIVACAINE HCL 0.5 % IJ SOLN
9.0000 mL | INTRAMUSCULAR | Status: AC | PRN
  Administered 2021-10-13: 9 mL via INTRA_ARTICULAR

## 2021-10-13 NOTE — Progress Notes (Signed)
Office Visit Note   Patient: Rebecca [name]           Date of Birth: 23-Dec-1970           MRN: [id number] Visit Date: 10/13/2021 Requested by: Sonny Masters, FNP [address] [place],  [place] [address] PCP: Sonny Masters, FNP  Subjective: Chief Complaint  Patient presents with   Other     Scan review    HPI: Rebecca [name] is a 51 y.o. female who presents to the office for MRI review. Patient denies any changes in symptoms.  Continues to complain mainly of anterior lateral right shoulder pain without any radiation.  Denies any neck symptoms, scapular pain, numbness/tingling.  She has occasional clicking sensation in the anterior lateral shoulder.  Pain does wake her up at night on occasion.  She works in a Surveyor, mining at a high school.  She has had prior subacromial injection by her PCP without much relief.  Physical therapy has provided no relief for her either.  MRI results revealed: MR SHOULDER RIGHT W CONTRAST  Result Date: 10/12/2021 CLINICAL DATA:  Chronic pain. Evaluate for frozen shoulder. Decreased range of motion. No prior surgery. EXAM: MRI OF THE RIGHT SHOULDER WITH CONTRAST TECHNIQUE: Multiplanar, multisequence MR imaging of the right shoulder was performed following the administration of intra-articular contrast. CONTRAST:  See Injection Documentation. COMPARISON:  Right shoulder radiographs 06/24/2021 FINDINGS: Rotator cuff: Mild intermediate T2 signal tendinosis with minimal degenerative fraying of the bursal aspect of the anterior infraspinatus tendon footprint (coronal series 11, image 10 and sagittal series 8, image 21). The supraspinatus is intact. Mild partial-thickness tearing of the articular side of the superior subscapularis tendon insertion (sagittal images 14 and 15, axial images 13 and 14). The teres minor is intact. Muscles: No rotator cuff muscle atrophy, fatty infiltration, or edema. Biceps long head: There is mild thinning of the articular side of  the distal aspect of the horizontal portion of the proximal long head of the biceps tendon (coronal series 11, image 16) just prior to the tendon descends into the bicipital groove. Acromioclavicular Joint: There are mild degenerative changes of the acromioclavicular joint including joint space narrowing, subchondral marrow edema, and peripheral osteophytosis. Type II acromion. No subacromial/subdeltoid bursitis. Glenohumeral Joint: Mild thinning of the inferior medial humeral head cartilage. Mild thinning of the anterior inferior glenoid cartilage with mild subchondral degenerative cystic change. Labrum: Mild degenerative irregularity of the superficial aspect of the anterior inferior glenoid labrum in the region of the greatest cartilage degenerative change (axial series 7 images 6 and 7) however otherwise no significant labral tear is seen. Bones:  No acute fracture. Other: None. IMPRESSION: 1. Minimal tendinosis and degenerative fraying of the bursal aspect of the anterior infraspinatus tendon footprint. 2. Mild partial-thickness tearing of the articular side of the superior subscapularis tendon. 3. Probable mild focal partial-thickness tear of the distal aspect of the horizontal portion of the proximal long head of the biceps tendon. 4. Mild degenerative changes of the acromioclavicular joint. 5. Mild anterior inferior glenoid cartilage degenerative changes with subchondral degenerative cystic change. Minimal peripheral labral degenerative irregularity in this region, but otherwise no significant labral tear. Electronically Signed   By: Neita Garnet M.D.   On: 10/12/2021 20:27                 ROS: All systems reviewed are negative as they relate to the chief complaint within the history of present illness.  Patient denies fevers or  chills.  Assessment & Plan: Visit Diagnoses:  1. Injury of tendon of long head of right biceps, initial encounter   2. Right shoulder pain, unspecified chronicity   3.  Arthritis of right acromioclavicular joint   4. Incomplete tear of right rotator cuff, unspecified whether traumatic     Plan: Rebecca [name] is a 51 y.o. female who presents to the office for evaluation of right shoulder pain and review of right shoulder MRI scan.  There were several findings that may contribute to this shoulder pain.  She has fraying of the bursal aspect of the infraspinatus tendon which may be symptomatic based on her increased pain with active external rotation.  Partial-thickness tearing of the subscapularis does not seem symptomatic for her with no pain on stressing the tendon on exam today.  Does have some partial-thickness tearing of the long head of the bicep tendon which is tender on exam in the bicipital groove.  She has some mild to moderate degenerative changes of the [facility] joint which has some tenderness and some pain with crossarm adduction compared with the contralateral shoulder.  There is also a focal area of marrow edema in the posterior aspect of the humeral head.  If this is a pain generator as well as the bicep tendon, pain could improved with intra-articular injection.  After discussion of options, patient would like to try glenohumeral injection as she had no relief from subacromial injection.  Glenohumeral injection successfully administered with the use of ultrasound guidance.  Patient tolerated procedure well.  Plan to give this about 2 to 4 weeks to take effect.  If she has no improvement in 2 weeks, she will call the office and we can work her in for acromioclavicular joint injection.  Otherwise, follow-up in 4 to 6 weeks.  No definitively operative problem in the shoulder at this time.  Follow-Up Instructions: No follow-ups on file.   Orders:  Orders Placed This Encounter  Procedures   US Guided Needle Placement - No Linked Charges   No orders of the defined types were placed in this encounter.     Procedures: Large Joint Inj: R glenohumeral on  10/13/2021 12:53 PM Indications: diagnostic evaluation and pain Details: 18 G 3.5 in needle, ultrasound-guided posterior approach  Arthrogram: No  Medications: 9 mL bupivacaine 0.5 %; 40 mg methylPREDNISolone acetate 40 MG/ML; 5 mL lidocaine 1 % Outcome: tolerated well, no immediate complications Procedure, treatment alternatives, risks and benefits explained, specific risks discussed. Consent was given by the patient. Immediately prior to procedure a time out was called to verify the correct patient, procedure, equipment, support staff and site/side marked as required. Patient was prepped and draped in the usual sterile fashion.       Clinical Data: No additional findings.  Objective: Vital Signs: LMP 10/30/2018 (Approximate) Comment: total hysterectmy  Physical Exam:  Constitutional: Patient appears well-developed HEENT:  Head: Normocephalic Eyes:EOM are normal Neck: Normal range of motion Cardiovascular: Normal rate Pulmonary/chest: Effort normal Neurologic: Patient is alert Skin: Skin is warm Psychiatric: Patient has normal mood and affect  Ortho Exam: Ortho exam demonstrates right shoulder with 45 degrees external rotation, 95 degrees abduction, 170 degrees forward flexion which is equivalent to the contralateral shoulder.  She has moderate tenderness over the [facility] joint and the bicipital groove.  She has excellent rotator cuff strength of supra, infra, subscap.  Supraspinatus strength testing does not really produce much pain but testing her external rotation strength does reproduce her pain to a significant  degree.  Absolutely no pain with subscapularis strength testing.  Increased pain with crossarm adduction.  No tenderness over the [facility] joint of the contralateral shoulder.  5/5 motor strength of bilateral grip strength, finger abduction, pronation/supination, bicep, tricep, deltoid.  Specialty Comments:  No specialty comments available.  Imaging: No results found.   PMFS  History: Patient Active Problem List   Diagnosis Date Noted   Chronic right shoulder pain 09/10/2021   Depression, major, single episode, mild (HCC) 04/28/2021   GAD (generalized anxiety disorder) 04/28/2021   Insomnia 01/14/2021   Mixed hyperlipidemia 01/14/2021   BMI 35.0-35.9,adult 01/14/2021   History of total hysterectomy 01/14/2021   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   Past Medical History:  Diagnosis Date   Anxiety    Hyperlipidemia    Insomnia     Family History  Problem Relation Age of Onset   Depression Mother    Heart disease Mother    Diabetes Father    Hyperlipidemia Father    Cancer Maternal Grandfather    Diabetes Paternal Grandmother     Past Surgical History:  Procedure Laterality Date   ABDOMINAL HYSTERECTOMY     BREAST EXCISIONAL BIOPSY Left    TONSILLECTOMY Bilateral    Social History   Occupational History   Occupation: [employer]  Tobacco Use   Smoking status: Never   Smokeless tobacco: Never  Vaping Use   Vaping Use: Never used  Substance and Sexual Activity   Alcohol use: Yes    Comment: 1-2 drinks per week   Drug use: Never   Sexual activity: Yes    Comment: total hysterectomy

## 2021-10-22 ENCOUNTER — Ambulatory Visit: Payer: BC Managed Care – PPO | Admitting: Surgical

## 2021-11-02 ENCOUNTER — Other Ambulatory Visit: Payer: Self-pay | Admitting: Family Medicine

## 2021-11-02 DIAGNOSIS — F5101 Primary insomnia: Secondary | ICD-10-CM

## 2021-11-02 DIAGNOSIS — F320 Major depressive disorder, single episode, mild: Secondary | ICD-10-CM

## 2021-11-02 DIAGNOSIS — F411 Generalized anxiety disorder: Secondary | ICD-10-CM

## 2021-11-10 ENCOUNTER — Ambulatory Visit: Payer: BC Managed Care – PPO | Admitting: Orthopedic Surgery

## 2021-11-17 ENCOUNTER — Ambulatory Visit: Payer: Self-pay

## 2021-11-17 ENCOUNTER — Ambulatory Visit (INDEPENDENT_AMBULATORY_CARE_PROVIDER_SITE_OTHER): Payer: BC Managed Care – PPO | Admitting: Orthopedic Surgery

## 2021-11-17 DIAGNOSIS — M19011 Primary osteoarthritis, right shoulder: Secondary | ICD-10-CM

## 2021-11-22 ENCOUNTER — Encounter: Payer: Self-pay | Admitting: Orthopedic Surgery

## 2021-11-22 NOTE — Progress Notes (Signed)
Office Visit Note   Patient: Rebecca [name]           Date of Birth: 05/26/1970           MRN: [id number] Visit Date: 11/17/2021 Requested by: Sonny Masters, FNP [address] [place],  [place] [address] PCP: Sonny Masters, FNP  Subjective: Chief Complaint  Patient presents with   Right Shoulder - Follow-up    HPI: Rebecca [name] is a 51 y.o. female who presents to the office complaining of right shoulder pain.  Patient returns following glenohumeral injection on 10/13/2021.  She did get slight improvement for about a week that was about 20% better but now pain has returned to how it was initially.  She is not taking any medications for pain.  She has a lot of potential pain generators in the shoulder based on MRI scan that was reviewed at last appointment..                ROS: All systems reviewed are negative as they relate to the chief complaint within the history of present illness.  Patient denies fevers or chills.  Assessment & Plan: Visit Diagnoses:  1. Arthritis of right acromioclavicular joint     Plan: Patient is a 51 year old female who presents for evaluation of right shoulder pain.  She has a lot of potential pain generators in the shoulder based on MRI scan from earlier this year.  However, her most compelling pain generator on exam is her [facility] joint today in the clinic.  She has a lot of tenderness over the [facility] joint that is asymmetric to the contralateral side as well as increased pain with abduction and crossarm adduction.  After discussion of options, with the lack of sustained improvement from glenohumeral injection, plan to try ultrasound-guided right AC joint injection today.  This was successfully administered and patient tolerated the procedure well.  Follow-up with the office in 6 weeks for clinical recheck.  Follow-Up Instructions: No follow-ups on file.   Orders:  Orders Placed This Encounter  Procedures   US Guided Needle Placement - No Linked  Charges   No orders of the defined types were placed in this encounter.     Procedures: Medium Joint Inj: R acromioclavicular on 11/17/2021 9:21 AM Indications: diagnostic evaluation and pain Details: 25 G 1.5 in needle, ultrasound-guided superior approach Medications: 3 mL lidocaine 1 %; 0.66 mL bupivacaine 0.25 %; 13.33 mg methylPREDNISolone acetate 40 MG/ML Outcome: tolerated well, no immediate complications Procedure, treatment alternatives, risks and benefits explained, specific risks discussed. Consent was given by the patient. Immediately prior to procedure a time out was called to verify the correct patient, procedure, equipment, support staff and site/side marked as required. Patient was prepped and draped in the usual sterile fashion.       Clinical Data: No additional findings.  Objective: Vital Signs: LMP 10/30/2018 (Approximate) Comment: total hysterectmy  Physical Exam:  Constitutional: Patient appears well-developed HEENT:  Head: Normocephalic Eyes:EOM are normal Neck: Normal range of motion Cardiovascular: Normal rate Pulmonary/chest: Effort normal Neurologic: Patient is alert Skin: Skin is warm Psychiatric: Patient has normal mood and affect  Ortho Exam: Ortho exam demonstrates right shoulder with moderate to severe tenderness over the right AC joint compared with no tenderness over the left AC joint.  Increased pain with crossarm adduction.  She has excellent rotator cuff strength of supra, infra, subscap.  No restriction of range of motion of the right shoulder relative to the  left.  No pain with cervical spine range of motion.  5/5 motor strength of bilateral grip strength, finger abduction, pronation/supination, bicep, tricep, deltoid.  Specialty Comments:  No specialty comments available.  Imaging: No results found.   PMFS History: Patient Active Problem List   Diagnosis Date Noted   Chronic right shoulder pain 09/10/2021   Depression, major,  single episode, mild (HCC) 04/28/2021   GAD (generalized anxiety disorder) 04/28/2021   Insomnia 01/14/2021   Mixed hyperlipidemia 01/14/2021   BMI 35.0-35.9,adult 01/14/2021   History of total hysterectomy 01/14/2021   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   Past Medical History:  Diagnosis Date   Anxiety    Hyperlipidemia    Insomnia     Family History  Problem Relation Age of Onset   Depression Mother    Heart disease Mother    Diabetes Father    Hyperlipidemia Father    Cancer Maternal Grandfather    Diabetes Paternal Grandmother     Past Surgical History:  Procedure Laterality Date   ABDOMINAL HYSTERECTOMY     BREAST EXCISIONAL BIOPSY Left    TONSILLECTOMY Bilateral    Social History   Occupational History   Occupation: [employer]  Tobacco Use   Smoking status: Never   Smokeless tobacco: Never  Vaping Use   Vaping Use: Never used  Substance and Sexual Activity   Alcohol use: Yes    Comment: 1-2 drinks per week   Drug use: Never   Sexual activity: Yes    Comment: total hysterectomy

## 2021-11-28 MED ORDER — BUPIVACAINE HCL 0.25 % IJ SOLN
0.6600 mL | INTRAMUSCULAR | Status: AC | PRN
  Administered 2021-11-17: .66 mL via INTRA_ARTICULAR

## 2021-11-28 MED ORDER — METHYLPREDNISOLONE ACETATE 40 MG/ML IJ SUSP
13.3300 mg | INTRAMUSCULAR | Status: AC | PRN
  Administered 2021-11-17: 13.33 mg via INTRA_ARTICULAR

## 2021-11-28 MED ORDER — LIDOCAINE HCL 1 % IJ SOLN
3.0000 mL | INTRAMUSCULAR | Status: AC | PRN
  Administered 2021-11-17: 3 mL

## 2021-12-02 ENCOUNTER — Other Ambulatory Visit: Payer: Self-pay | Admitting: Family Medicine

## 2021-12-02 DIAGNOSIS — K219 Gastro-esophageal reflux disease without esophagitis: Secondary | ICD-10-CM

## 2021-12-02 DIAGNOSIS — E782 Mixed hyperlipidemia: Secondary | ICD-10-CM

## 2021-12-15 ENCOUNTER — Encounter: Payer: Self-pay | Admitting: Orthopedic Surgery

## 2021-12-15 ENCOUNTER — Ambulatory Visit: Payer: BC Managed Care – PPO | Admitting: Orthopedic Surgery

## 2021-12-15 DIAGNOSIS — M25511 Pain in right shoulder: Secondary | ICD-10-CM | POA: Diagnosis not present

## 2021-12-15 DIAGNOSIS — M19011 Primary osteoarthritis, right shoulder: Secondary | ICD-10-CM

## 2021-12-15 NOTE — Progress Notes (Unsigned)
Office Visit Note   Patient: Rebecca [name]           Date of Birth: 23-Oct-1970           MRN: [id number] Visit Date: 12/15/2021 Requested by: Rebecca Masters, FNP [address] [place],  [place] [address] PCP: Rebecca Masters, FNP  Subjective: Chief Complaint  Patient presents with   Right Shoulder - Pain    HPI: Rebecca [name] is a 51 year old patient with right shoulder pain.  She did have a right shoulder AC joint injection 11/17/2021 which did not help.  And also had a glenohumeral joint injection which did not help.  Reports pain in the lateral deltoid region.  Been ongoing for a year.  She is having more bad days now than she was before.  Has some grinding with range of motion.  She states at times the shoulder feels like it will pop out of place.  She has done physical therapy which did not help.  Denies any neck pain or numbness and tingling and no radiation of pain below the elbow.  She works in a Tax inspector.  MRI scan of the shoulder on the right-hand side from June 2023 is reviewed.  No full-thickness rotator cuff tear.  No definite superior labral tear but there is some thinning of the biceps tendon as it enters into the bicipital groove.  Some marrow edema changes in the [name] joint as well which are mild.              ROS: All systems reviewed are negative as they relate to the chief complaint within the history of present illness.  Patient denies  fevers or chills.   Assessment & Plan: Visit Diagnoses:  1. Arthritis of right acromioclavicular joint   2. Right shoulder pain, unspecified chronicity     Plan: Impression is persistent right shoulder pain despite glenohumeral joint injection in [name] joint injection.  Symptoms ongoing for a year.  Failure of conservative management.  Plan at this time is operative evaluation of the labrum and rotator cuff and biceps tendon.  Could consider biceps tenodesis if that MRI finding turns out to be valid in terms of partial-thickness  tearing of the biceps tendon.  Could also perform bursectomy at that time.  Decision for or against Rebecca [name] joint resection concurrently can be made closer to the time of surgery which it appears will be in the winter.  The risk and benefits of surgery are discussed with the patient including not limited to infection nerve vessel damage incomplete pain relief as well as a period of approximately a month out of school particularly if biceps tenodesis is required.  Patient understands the risk and benefits and wishes to proceed.  All questions answered.  Would use a shoulder brace CPM for postop range of motion.  Follow-Up Instructions: No follow-ups on file.   Orders:  No orders of the defined types were placed in this encounter.  No orders of the defined types were placed in this encounter.     Procedures: No procedures performed   Clinical Data: No additional findings.  Objective: Vital Signs: LMP 10/30/2018 (Approximate) Comment: total hysterectmy  Physical Exam:   Constitutional: Patient appears well-developed HEENT:  Head: Normocephalic Eyes:EOM are normal Neck: Normal range of motion Cardiovascular: Normal rate Pulmonary/chest: Effort normal Neurologic: Patient is alert Skin: Skin is warm Psychiatric: Patient has normal mood and affect   Ortho Exam: Ortho exam demonstrates full active and passive range  of motion of both shoulders.  Mild AC joint tenderness on the right compared to the left.  She has symmetric motion passively of of 70/100/170 with good rotator cuff strength infraspinatus supraspinatus and subscap muscle testing.  O'Brien's testing equivocal on the right negative on the left.  No scapular dyskinesia with forward flexion.  No paresthesias in the right arm C5-T1 distribution.  Radial pulses intact bilaterally  Specialty Comments:  No specialty comments available.  Imaging: No results found.   PMFS History: Patient Active Problem List   Diagnosis Date Noted    Chronic right shoulder pain 09/10/2021   Depression, major, single episode, mild (HCC) 04/28/2021   GAD (generalized anxiety disorder) 04/28/2021   Insomnia 01/14/2021   Mixed hyperlipidemia 01/14/2021   BMI 35.0-35.9,adult 01/14/2021   History of total hysterectomy 01/14/2021   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   Past Medical History:  Diagnosis Date   Anxiety    Hyperlipidemia    Insomnia     Family History  Problem Relation Age of Onset   Depression Mother    Heart disease Mother    Diabetes Father    Hyperlipidemia Father    Cancer Maternal Grandfather    Diabetes Paternal Grandmother     Past Surgical History:  Procedure Laterality Date   ABDOMINAL HYSTERECTOMY     BREAST EXCISIONAL BIOPSY Left    TONSILLECTOMY Bilateral    Social History   Occupational History   Occupation: Rebecca [name]  Tobacco Use   Smoking status: Never   Smokeless tobacco: Never  Vaping Use   Vaping Use: Never used  Substance and Sexual Activity   Alcohol use: Yes    Comment: 1-2 drinks per week   Drug use: Never   Sexual activity: Yes    Comment: total hysterectomy

## 2021-12-16 ENCOUNTER — Encounter: Payer: Self-pay | Admitting: Family Medicine

## 2021-12-16 ENCOUNTER — Ambulatory Visit: Payer: BC Managed Care – PPO | Admitting: Family Medicine

## 2021-12-16 ENCOUNTER — Encounter: Payer: Self-pay | Admitting: Orthopedic Surgery

## 2021-12-16 VITALS — BP 116/83 | HR 104 | Temp 99.6°F | Ht 65.0 in | Wt 201.0 lb

## 2021-12-16 DIAGNOSIS — R6889 Other general symptoms and signs: Secondary | ICD-10-CM

## 2021-12-16 DIAGNOSIS — J069 Acute upper respiratory infection, unspecified: Secondary | ICD-10-CM

## 2021-12-16 DIAGNOSIS — R110 Nausea: Secondary | ICD-10-CM

## 2021-12-16 LAB — VERITOR FLU A/B WAIVED
Influenza A: NEGATIVE
Influenza B: NEGATIVE

## 2021-12-16 MED ORDER — ONDANSETRON HCL 4 MG PO TABS: 4.0000 mg | ORAL_TABLET | Freq: Three times a day (TID) | ORAL | 0 refills | Status: DC | PRN

## 2021-12-16 MED ORDER — PSEUDOEPH-BROMPHEN-DM 30-2-10 MG/5ML PO SYRP: 5.0000 mL | ORAL_SOLUTION | Freq: Four times a day (QID) | ORAL | 0 refills | Status: DC | PRN

## 2021-12-16 NOTE — Progress Notes (Signed)
   Subjective:  Patient ID: Rebecca [name], female    DOB: 05/09/1970, 51 y.o.   MRN: [id number]  Patient Care Team: Rakes, Linda M, FNP as PCP - General (Family Medicine)   Chief Complaint:  URI   HPI: Rebecca [name] is a 51 y.o. female presenting on 12/16/2021 for URI   Pt reports flu-like symptoms, onset yesterday. Husband has had similar symptoms for last 4 days, he was negative for COVID at home.   URI  This is a new problem. The current episode started yesterday. The problem has been gradually worsening. The maximum temperature recorded prior to her arrival was 100.4 - 100.9 F. Associated symptoms include congestion, coughing, headaches, nausea, a plugged ear sensation, rhinorrhea, sinus pain and a sore throat. Pertinent negatives include no abdominal pain, chest pain, diarrhea, dysuria, ear pain, joint pain, joint swelling, neck pain, rash, sneezing, swollen glands, vomiting or wheezing. She has tried acetaminophen, decongestant and antihistamine for the symptoms. The treatment provided no relief.   Relevant past medical, surgical, family, and social history reviewed and updated as indicated.  Allergies and medications reviewed and updated. Data reviewed: Chart in Epic.   Past Medical History:  Diagnosis Date   Anxiety    Hyperlipidemia    Insomnia     Past Surgical History:  Procedure Laterality Date   ABDOMINAL HYSTERECTOMY     BREAST EXCISIONAL BIOPSY Left    TONSILLECTOMY Bilateral     Social History   Socioeconomic History   Marital status: Married    Spouse name: Not on file   Number of children: Not on file   Years of education: Not on file   Highest education level: Not on file  Occupational History   Occupation: [employer]  Tobacco Use   Smoking status: Never   Smokeless tobacco: Never  Vaping Use   Vaping Use: Never used  Substance and Sexual Activity   Alcohol use: Yes    Comment: 1-2 drinks per week   Drug use: Never    Sexual activity: Yes    Comment: total hysterectomy  Other Topics Concern   Not on file  Social History Narrative   Not on file   Social Determinants of Health   Financial Resource Strain: Not on file  Food Insecurity: Not on file  Transportation Needs: Not on file  Physical Activity: Not on file  Stress: Not on file  Social Connections: Not on file  Intimate Partner Violence: Not on file    Outpatient Encounter Medications as of 12/16/2021  Medication Sig   atorvastatin (LIPITOR) 20 MG tablet TAKE 1 TABLET BY MOUTH EVERY DAY   brompheniramine-pseudoephedrine-DM 30-2-10 MG/5ML syrup Take 5 mLs by mouth 4 (four) times daily as needed.   doxepin (SINEQUAN) 25 MG capsule TAKE 1 CAPSULE BY MOUTH EVERYDAY AT BEDTIME   estradiol (ESTRACE) 0.5 MG tablet Take 1 tablet (0.5 mg total) by mouth daily.   fluticasone (FLONASE) 50 MCG/ACT nasal spray Place 2 sprays into both nostrils daily.   omeprazole (PRILOSEC) 20 MG capsule TAKE 1 CAPSULE BY MOUTH EVERY DAY   ondansetron (ZOFRAN) 4 MG tablet Take 1 tablet (4 mg total) by mouth every 8 (eight) hours as needed for nausea or vomiting.   Semaglutide-Weight Management (WEGOVY) 2.4 MG/0.75ML SOAJ Inject 2.4 mg into the skin once a week.   [DISCONTINUED] cetirizine (ZYRTEC) 10 MG tablet Take 1 tablet (10 mg total) by mouth daily.   [DISCONTINUED] FLUoxetine (PROZAC) 10 MG capsule TAKE 1   CAPSULE BY MOUTH EVERY DAY   No facility-administered encounter medications on file as of 12/16/2021.    No Known Allergies  Review of Systems  Constitutional:  Positive for activity change, appetite change, chills, fatigue and fever. Negative for diaphoresis and unexpected weight change.  HENT:  Positive for congestion, postnasal drip, rhinorrhea, sinus pressure, sinus pain and sore throat. Negative for dental problem, drooling, ear discharge, ear pain, facial swelling, hearing loss, mouth sores, nosebleeds, sneezing, tinnitus, trouble swallowing and voice  change.   Respiratory:  Positive for cough. Negative for apnea, choking, chest tightness, shortness of breath, wheezing and stridor.   Cardiovascular:  Negative for chest pain.  Gastrointestinal:  Positive for nausea. Negative for abdominal pain, diarrhea and vomiting.  Genitourinary:  Negative for decreased urine volume, difficulty urinating and dysuria.  Musculoskeletal:  Positive for myalgias. Negative for arthralgias, back pain, gait problem, joint pain, joint swelling, neck pain and neck stiffness.  Skin:  Negative for rash.  Neurological:  Positive for headaches. Negative for dizziness, tremors, seizures, syncope, facial asymmetry, speech difficulty, weakness, light-headedness and numbness.  Psychiatric/Behavioral:  Negative for confusion.   All other systems reviewed and are negative.       Objective:  BP 116/83   Pulse (!) 104   Temp 99.6 F (37.6 C)   Ht 5' 5" (1.651 m)   Wt 201 lb (91.2 kg)   LMP 10/30/2018 (Approximate) Comment: total hysterectmy  SpO2 93%   BMI 33.45 kg/m    Wt Readings from Last 3 Encounters:  12/16/21 201 lb (91.2 kg)  10/08/21 199 lb (90.3 kg)  08/20/21 198 lb (89.8 kg)    Physical Exam Vitals and nursing note reviewed.  Constitutional:      General: She is not in acute distress.    Appearance: Normal appearance. She is well-developed and well-groomed. She is obese. She is not ill-appearing, toxic-appearing or diaphoretic.  HENT:     Head: Normocephalic and atraumatic.     Jaw: There is normal jaw occlusion.     Right Ear: Hearing, ear canal and external ear normal. A middle ear effusion is present. There is no impacted cerumen. Tympanic membrane is not erythematous.     Left Ear: Hearing, ear canal and external ear normal. A middle ear effusion is present. There is no impacted cerumen. Tympanic membrane is not erythematous.     Nose: Congestion present. No rhinorrhea.     Right Turbinates: Swollen.     Left Turbinates: Swollen.     Right  Sinus: Maxillary sinus tenderness and frontal sinus tenderness present.     Left Sinus: Maxillary sinus tenderness and frontal sinus tenderness present.     Mouth/Throat:     Lips: Pink.     Mouth: Mucous membranes are moist.     Pharynx: Oropharynx is clear. Uvula midline. Posterior oropharyngeal erythema present. No pharyngeal swelling, oropharyngeal exudate or uvula swelling.     Tonsils: No tonsillar exudate or tonsillar abscesses.  Eyes:     General: Lids are normal.     Extraocular Movements: Extraocular movements intact.     Conjunctiva/sclera: Conjunctivae normal.     Pupils: Pupils are equal, round, and reactive to light.  Neck:     Thyroid: No thyroid mass, thyromegaly or thyroid tenderness.     Vascular: No carotid bruit or JVD.     Trachea: Trachea and phonation normal.  Cardiovascular:     Rate and Rhythm: Normal rate and regular rhythm.     Chest Wall:  PMI is not displaced.     Pulses: Normal pulses.     Heart sounds: Normal heart sounds. No murmur heard.    No friction rub. No gallop.  Pulmonary:     Effort: Pulmonary effort is normal. No respiratory distress.     Breath sounds: Normal breath sounds. No wheezing.  Abdominal:     General: Bowel sounds are normal. There is no distension or abdominal bruit.     Palpations: Abdomen is soft. There is no hepatomegaly or splenomegaly.     Tenderness: There is no abdominal tenderness. There is no right CVA tenderness or left CVA tenderness.     Hernia: No hernia is present.  Musculoskeletal:        General: Normal range of motion.     Cervical back: Normal range of motion and neck supple.     Right lower leg: No edema.     Left lower leg: No edema.  Lymphadenopathy:     Cervical: No cervical adenopathy.  Skin:    General: Skin is warm and dry.     Capillary Refill: Capillary refill takes less than 2 seconds.     Coloration: Skin is not cyanotic, jaundiced or pale.     Findings: No rash.  Neurological:     General: No  focal deficit present.     Mental Status: She is alert and oriented to person, place, and time.     Sensory: Sensation is intact.     Motor: Motor function is intact.     Coordination: Coordination is intact.     Gait: Gait is intact.     Deep Tendon Reflexes: Reflexes are normal and symmetric.  Psychiatric:        Attention and Perception: Attention and perception normal.        Mood and Affect: Mood and affect normal.        Speech: Speech normal.        Behavior: Behavior normal. Behavior is cooperative.        Thought Content: Thought content normal.        Cognition and Memory: Cognition and memory normal.        Judgment: Judgment normal.     Results for orders placed or performed in visit on 10/08/21  CBC with Differential/Platelet  Result Value Ref Range   WBC 6.8 3.4 - 10.8 x10E3/uL   RBC 4.59 3.77 - 5.28 x10E6/uL   Hemoglobin 14.4 11.1 - 15.9 g/dL   Hematocrit 42.8 34.0 - 46.6 %   MCV 93 79 - 97 fL   MCH 31.4 26.6 - 33.0 pg   MCHC 33.6 31.5 - 35.7 g/dL   RDW 13.6 11.7 - 15.4 %   Platelets 290 150 - 450 x10E3/uL   Neutrophils 54 Not Estab. %   Lymphs 36 Not Estab. %   Monocytes 7 Not Estab. %   Eos 2 Not Estab. %   Basos 1 Not Estab. %   Neutrophils Absolute 3.8 1.4 - 7.0 x10E3/uL   Lymphocytes Absolute 2.5 0.7 - 3.1 x10E3/uL   Monocytes Absolute 0.4 0.1 - 0.9 x10E3/uL   EOS (ABSOLUTE) 0.1 0.0 - 0.4 x10E3/uL   Basophils Absolute 0.1 0.0 - 0.2 x10E3/uL   Immature Granulocytes 0 Not Estab. %   Immature Grans (Abs) 0.0 0.0 - 0.1 x10E3/uL  CMP14+EGFR  Result Value Ref Range   Glucose 94 70 - 99 mg/dL   BUN 9 6 - 24 mg/dL   Creatinine, Ser 0.90 0.57 -   1.00 mg/dL   eGFR 79 >59 mL/min/1.73   BUN/Creatinine Ratio 10 9 - 23   Sodium 140 134 - 144 mmol/L   Potassium 4.2 3.5 - 5.2 mmol/L   Chloride 103 96 - 106 mmol/L   CO2 24 20 - 29 mmol/L   Calcium 9.3 8.7 - 10.2 mg/dL   Total Protein 6.6 6.0 - 8.5 g/dL   Albumin 4.4 3.8 - 4.8 g/dL   Globulin, Total 2.2 1.5 -  4.5 g/dL   Albumin/Globulin Ratio 2.0 1.2 - 2.2   Bilirubin Total 0.4 0.0 - 1.2 mg/dL   Alkaline Phosphatase 100 44 - 121 IU/L   AST 27 0 - 40 IU/L   ALT 63 (H) 0 - 32 IU/L  Lipid panel  Result Value Ref Range   Cholesterol, Total 185 100 - 199 mg/dL   Triglycerides 164 (H) 0 - 149 mg/dL   HDL 45 >39 mg/dL   VLDL Cholesterol Cal 29 5 - 40 mg/dL   LDL Chol Calc (NIH) 111 (H) 0 - 99 mg/dL   Chol/HDL Ratio 4.1 0.0 - 4.4 ratio  TSH  Result Value Ref Range   TSH 1.870 0.450 - 4.500 uIU/mL       Pertinent labs & imaging results that were available during my care of the patient were reviewed by me and considered in my medical decision making.  Assessment & Plan:  Marlyn was seen today for uri.  Diagnoses and all orders for this visit:  Flu-like symptoms Influenza negative. COVID pending.  -     Veritor Flu A/B Waived -     Novel Coronavirus, NAA ([facility])  Nausea in adult Symptomatic care discussed in detail. Medications as prescribed. Report, new, worsening, or persistent symptoms.  -     ondansetron (ZOFRAN) 4 MG tablet; Take 1 tablet (4 mg total) by mouth every 8 (eight) hours as needed for nausea or vomiting.  URI with cough and congestion Influenza negative. COVID pending. No indications of acute bacterial infection. Bromfed as prescribed. Continue Flonase. Report new, worsening, or persistent symptoms. If still symptomatic on Monday, will send in antibiotic therapy.  -     brompheniramine-pseudoephedrine-DM 30-2-10 MG/5ML syrup; Take 5 mLs by mouth 4 (four) times daily as needed.     Continue all other maintenance medications.  Follow up plan: Return if symptoms worsen or fail to improve.   Continue healthy lifestyle choices, including diet (rich in fruits, vegetables, and lean proteins, and low in salt and simple carbohydrates) and exercise (at least 30 minutes of moderate physical activity daily).  Educational handout given for URI  The above assessment and  management plan was discussed with the patient. The patient verbalized understanding of and has agreed to the management plan. Patient is aware to call the clinic if they develop any new symptoms or if symptoms persist or worsen. Patient is aware when to return to the clinic for a follow-up visit. Patient educated on when it is appropriate to go to the emergency department.   Monia Pouch, FNP-C [facility] [phone]

## 2021-12-16 NOTE — Patient Instructions (Signed)
Continue Flonase daily. Medications as prescribed. Report new, worsening, or persistent symptoms.

## 2021-12-17 ENCOUNTER — Encounter: Payer: Self-pay | Admitting: Family Medicine

## 2021-12-17 LAB — NOVEL CORONAVIRUS, NAA: SARS-CoV-2, NAA: DETECTED — AB

## 2021-12-17 MED ORDER — AMOXICILLIN-POT CLAVULANATE 875-125 MG PO TABS: 1.0000 | ORAL_TABLET | Freq: Two times a day (BID) | ORAL | 0 refills | Status: AC

## 2021-12-17 NOTE — Addendum Note (Signed)
Addended by: Sonny Masters on: 12/17/2021 09:52 AM   Modules accepted: Orders

## 2021-12-18 ENCOUNTER — Other Ambulatory Visit: Payer: Self-pay | Admitting: Family Medicine

## 2021-12-18 DIAGNOSIS — J069 Acute upper respiratory infection, unspecified: Secondary | ICD-10-CM

## 2021-12-20 ENCOUNTER — Encounter: Payer: Self-pay | Admitting: Family Medicine

## 2021-12-20 ENCOUNTER — Telehealth: Payer: Self-pay | Admitting: Family Medicine

## 2021-12-20 NOTE — Telephone Encounter (Signed)
Patient said she is not feeling better and still has a cough. Will need the work note to be extended. She is unsure of when she will be able to go back to work, would like to speak to nurse. Please call back and advise.

## 2021-12-20 NOTE — Telephone Encounter (Signed)
6 weeks out of work as a reasonable estimate

## 2021-12-21 NOTE — Telephone Encounter (Signed)
Left message making pt aware.  Work note placed up front.

## 2022-01-14 ENCOUNTER — Ambulatory Visit: Payer: BC Managed Care – PPO | Admitting: Family Medicine

## 2022-01-18 ENCOUNTER — Encounter: Payer: Self-pay | Admitting: Family Medicine

## 2022-01-18 ENCOUNTER — Ambulatory Visit: Payer: BC Managed Care – PPO | Admitting: Family Medicine

## 2022-01-18 VITALS — BP 117/78 | HR 92 | Temp 98.3°F | Ht 65.0 in | Wt 197.0 lb

## 2022-01-18 DIAGNOSIS — M25511 Pain in right shoulder: Secondary | ICD-10-CM

## 2022-01-18 DIAGNOSIS — Z6835 Body mass index (BMI) 35.0-35.9, adult: Secondary | ICD-10-CM

## 2022-01-18 DIAGNOSIS — F411 Generalized anxiety disorder: Secondary | ICD-10-CM | POA: Diagnosis not present

## 2022-01-18 DIAGNOSIS — Z23 Encounter for immunization: Secondary | ICD-10-CM | POA: Diagnosis not present

## 2022-01-18 DIAGNOSIS — F320 Major depressive disorder, single episode, mild: Secondary | ICD-10-CM

## 2022-01-18 DIAGNOSIS — E6609 Other obesity due to excess calories: Secondary | ICD-10-CM

## 2022-01-18 DIAGNOSIS — T887XXA Unspecified adverse effect of drug or medicament, initial encounter: Secondary | ICD-10-CM | POA: Diagnosis not present

## 2022-01-18 DIAGNOSIS — G8929 Other chronic pain: Secondary | ICD-10-CM

## 2022-01-18 DIAGNOSIS — R109 Unspecified abdominal pain: Secondary | ICD-10-CM

## 2022-01-18 DIAGNOSIS — Z7689 Persons encountering health services in other specified circumstances: Secondary | ICD-10-CM

## 2022-01-18 MED ORDER — ONDANSETRON HCL 4 MG PO TABS: 4.0000 mg | ORAL_TABLET | Freq: Three times a day (TID) | ORAL | 0 refills | Status: DC | PRN

## 2022-01-18 MED ORDER — BUPROPION HCL ER (XL) 150 MG PO TB24: 150.0000 mg | ORAL_TABLET | Freq: Every day | ORAL | 6 refills | Status: DC

## 2022-01-18 NOTE — Progress Notes (Signed)
Subjective:  Patient ID: Rebecca [name], female    DOB: 04-Jan-1971, 51 y.o.   MRN: [id number]  Patient Care Team: Baruch Gouty, FNP as PCP - General (Family Medicine)   Chief Complaint:  Weight Check   HPI: Rebecca [name] is a 51 y.o. female presenting on 01/18/2022 for Weight Check   1. BMI 35.0-35.9,adult 2. Encounter for weight management Was previously on Mounjaro and switched to [facility]. Weight started at 215 lbs, she is down to 197 lbs. She feels the [facility] has not been that beneficial for weight loss. She reports increased appetite and some nausea and diarrhea over the last 2-3 weeks. States today when she was having a BM she noticed right flank pain, has since resolved but reports it was concerning.   3. Chronic right shoulder pain Followed by ortho and has upcoming surgery in December.   6. Depression, major, single episode, mild ([place]) 7. GAD (generalized anxiety disorder) Currently only on Doxepin for sleep. Has not tried other medications in the past.     01/18/2022    4:13 PM 12/16/2021    8:47 AM 12/16/2021    8:45 AM 10/08/2021    8:11 AM 08/20/2021    3:54 PM  Depression screen PHQ 2/9  Decreased Interest 0 0 0 0 0  Down, Depressed, Hopeless 0 0 0 0 0  PHQ - 2 Score 0 0 0 0 0  Altered sleeping 2 2 0 0 2  Tired, decreased energy 0 2 0 1 1  Change in appetite 0 1 0 0 0  Feeling bad or failure about yourself  0 0 0 0 0  Trouble concentrating 0 0 0 0 0  Moving slowly or fidgety/restless 0 0 0 0 0  Suicidal thoughts 0 0  0 0  PHQ-9 Score 2 5 0 1 3  Difficult doing work/chores Not difficult at all Not difficult at all Not difficult at all Not difficult at all Not difficult at all      01/18/2022    4:14 PM 12/16/2021    8:45 AM 10/08/2021    8:11 AM 08/20/2021    3:55 PM  GAD 7 : Generalized Anxiety Score  Nervous, Anxious, on Edge 0 0 0 0  Control/stop worrying 0 0 0 0  Worry too much - different things 0 0 0 0  Trouble relaxing 0 0 0 0  Restless  0 0 0 0  Easily annoyed or irritable 0 0 0 0  Afraid - awful might happen 0 0 0 0  Total GAD 7 Score 0 0 0 0  Anxiety Difficulty  Not difficult at all Not difficult at all Not difficult at all       Relevant past medical, surgical, family, and social history reviewed and updated as indicated.  Allergies and medications reviewed and updated. Data reviewed: Chart in Epic.   Past Medical History:  Diagnosis Date   Anxiety    Hyperlipidemia    Insomnia     Past Surgical History:  Procedure Laterality Date   ABDOMINAL HYSTERECTOMY     BREAST EXCISIONAL BIOPSY Left    TONSILLECTOMY Bilateral     Social History   Socioeconomic History   Marital status: Married    Spouse name: Not on file   Number of children: Not on file   Years of education: Not on file   Highest education level: Not on file  Occupational History   Occupation: [employer]  Tobacco  Use   Smoking status: Never   Smokeless tobacco: Never  Vaping Use   Vaping Use: Never used  Substance and Sexual Activity   Alcohol use: Yes    Comment: 1-2 drinks per week   Drug use: Never   Sexual activity: Yes    Comment: total hysterectomy  Other Topics Concern   Not on file  Social History Narrative   Not on file   Social Determinants of Health   Financial Resource Strain: Not on file  Food Insecurity: Not on file  Transportation Needs: Not on file  Physical Activity: Not on file  Stress: Not on file  Social Connections: Not on file  Intimate Partner Violence: Not on file    Outpatient Encounter Medications as of 01/18/2022  Medication Sig   atorvastatin (LIPITOR) 20 MG tablet TAKE 1 TABLET BY MOUTH EVERY DAY   buPROPion (WELLBUTRIN XL) 150 MG 24 hr tablet Take 1 tablet (150 mg total) by mouth daily.   doxepin (SINEQUAN) 25 MG capsule TAKE 1 CAPSULE BY MOUTH EVERYDAY AT BEDTIME   estradiol (ESTRACE) 0.5 MG tablet Take 1 tablet (0.5 mg total) by mouth daily.   fluticasone (FLONASE) 50  MCG/ACT nasal spray SPRAY 2 SPRAYS INTO EACH NOSTRIL EVERY DAY   omeprazole (PRILOSEC) 20 MG capsule TAKE 1 CAPSULE BY MOUTH EVERY DAY   ondansetron (ZOFRAN) 4 MG tablet Take 1 tablet (4 mg total) by mouth every 8 (eight) hours as needed for nausea or vomiting.   Semaglutide-Weight Management (WEGOVY) 2.4 MG/0.75ML SOAJ Inject 2.4 mg into the skin once a week.   [DISCONTINUED] brompheniramine-pseudoephedrine-DM 30-2-10 MG/5ML syrup Take 5 mLs by mouth 4 (four) times daily as needed.   [DISCONTINUED] ondansetron (ZOFRAN) 4 MG tablet Take 1 tablet (4 mg total) by mouth every 8 (eight) hours as needed for nausea or vomiting.   No facility-administered encounter medications on file as of 01/18/2022.    No Known Allergies  Review of Systems  Constitutional:  Positive for appetite change. Negative for activity change, chills, diaphoresis, fatigue, fever and unexpected weight change.  HENT: Negative.    Eyes: Negative.   Respiratory:  Negative for cough, chest tightness and shortness of breath.   Cardiovascular:  Negative for chest pain, palpitations and leg swelling.  Gastrointestinal:  Positive for diarrhea and nausea. Negative for abdominal distention, abdominal pain, anal bleeding, blood in stool, constipation, rectal pain and vomiting.  Endocrine: Negative.  Negative for cold intolerance, heat intolerance, polydipsia, polyphagia and polyuria.  Genitourinary:  Negative for decreased urine volume, difficulty urinating, dysuria, frequency and urgency.  Musculoskeletal:  Negative for arthralgias and myalgias.  Skin: Negative.   Allergic/Immunologic: Negative.   Neurological:  Negative for dizziness, tremors, seizures, syncope, facial asymmetry, speech difficulty, weakness, light-headedness, numbness and headaches.  Hematological: Negative.   Psychiatric/Behavioral:  Positive for sleep disturbance. Negative for agitation, behavioral problems, confusion, decreased concentration, dysphoric mood,  hallucinations, self-injury and suicidal ideas. The patient is not nervous/anxious and is not hyperactive.   All other systems reviewed and are negative.       Objective:  BP 117/78   Pulse 92   Temp 98.3 F (36.8 C)   Ht _0  (1.651 m)   Wt 197 lb (89.4 kg)   LMP 10/30/2018 (Approximate) Comment: total hysterectmy  SpO2 94%   BMI 32.78 kg/m    Wt Readings from Last 3 Encounters:  01/18/22 197 lb (89.4 kg)  12/16/21 201 lb (91.2 kg)  10/08/21 199 lb (90.3 kg)  Physical Exam Vitals and nursing note reviewed.  Constitutional:      General: She is not in acute distress.    Appearance: Normal appearance. She is well-developed and well-groomed. She is obese. She is not ill-appearing, toxic-appearing or diaphoretic.  HENT:     Head: Normocephalic and atraumatic.     Jaw: There is normal jaw occlusion.     Right Ear: Hearing normal.     Left Ear: Hearing normal.     Nose: Nose normal.     Mouth/Throat:     Lips: Pink.     Mouth: Mucous membranes are moist.     Pharynx: Uvula midline.  Eyes:     General: Lids are normal.     Pupils: Pupils are equal, round, and reactive to light.  Neck:     Thyroid: No thyroid mass, thyromegaly or thyroid tenderness.     Vascular: No JVD.     Trachea: Trachea and phonation normal.  Cardiovascular:     Rate and Rhythm: Normal rate and regular rhythm.     Chest Wall: PMI is not displaced.     Pulses: Normal pulses.     Heart sounds: Normal heart sounds. No murmur heard.    No friction rub. No gallop.  Pulmonary:     Effort: Pulmonary effort is normal. No respiratory distress.     Breath sounds: Normal breath sounds. No wheezing.  Abdominal:     General: Bowel sounds are normal. There is no distension or abdominal bruit.     Palpations: Abdomen is soft. There is no hepatomegaly or splenomegaly.     Tenderness: There is no abdominal tenderness. There is no right CVA tenderness or left CVA tenderness.     Hernia: No hernia is present.   Musculoskeletal:     Cervical back: Normal range of motion and neck supple. No tenderness.     Right lower leg: No edema.     Left lower leg: No edema.  Skin:    General: Skin is warm and dry.     Capillary Refill: Capillary refill takes less than 2 seconds.     Coloration: Skin is not cyanotic, jaundiced or pale.     Findings: No rash.  Neurological:     General: No focal deficit present.     Mental Status: She is alert and oriented to person, place, and time.     Sensory: Sensation is intact.     Motor: Motor function is intact.     Coordination: Coordination is intact.     Gait: Gait is intact.     Deep Tendon Reflexes: Reflexes are normal and symmetric.  Psychiatric:        Attention and Perception: Attention and perception normal.        Mood and Affect: Mood and affect normal.        Speech: Speech normal.        Behavior: Behavior normal. Behavior is cooperative.        Thought Content: Thought content normal.        Cognition and Memory: Cognition and memory normal.        Judgment: Judgment normal.     Results for orders placed or performed in visit on 12/16/21  Novel Coronavirus, NAA ([facility])   Specimen: Nasopharyngeal(NP) swabs in vial transport medium  Result Value Ref Range   SARS-CoV-2, NAA Detected (A) Not Detected  Veritor Flu A/B Waived  Result Value Ref Range   Influenza A Negative Negative  Influenza B Negative Negative       Pertinent labs & imaging results that were available during my care of the patient were reviewed by me and considered in my medical decision making.  Assessment & Plan:  Rebecca [name] was seen today for weight check.  Diagnoses and all orders for this visit:  BMI 35.0-35.9,adult Encounter for weight management Currently on Wegovy 2.4 mg. Started having some side effects over the last few weeks. Feels Mancel Parsons has not been as beneficial as Mounjaro for her weight loss and appetite control. States she does tend to binge eat.  -      CMP14+EGFR -     CBC with Differential/Platelet -     Thyroid Panel With TSH -     Lipid panel  Medication side effects Nausea and diarrhea from [facility]. Pt aware of how to avoid symptoms with small frequent meals that are not fried, greasy, spicy, or heavy. Zofran as needed for nausea. Report continued symptoms.  -     ondansetron (ZOFRAN) 4 MG tablet; Take 1 tablet (4 mg total) by mouth every 8 (eight) hours as needed for nausea or vomiting.  Chronic right shoulder pain Has surgery scheduled in December.   Flank pain Urinalysis unremarkable in office. Pt aware to report any new, worsening, or persistent symptoms.  -     Urinalysis  Depression, major, single episode, mild ([facility]) GAD (generalized anxiety disorder) Doing fairly well but does have binge eating tendencies. Will trial Wellbutrin to see if beneficial.  -     buPROPion (WELLBUTRIN XL) 150 MG 24 hr tablet; Take 1 tablet (150 mg total) by mouth daily.  Need for immunization against influenza -     Flu Vaccine QUAD 3moIM (Fluarix, Fluzone & Alfiuria Quad PF)     Continue all other maintenance medications.  Follow up plan: Return in about 6 weeks (around 03/01/2022), or if symptoms worsen or fail to improve, for Medication follow up.   Continue healthy lifestyle choices, including diet (rich in fruits, vegetables, and lean proteins, and low in salt and simple carbohydrates) and exercise (at least 30 minutes of moderate physical activity daily).  Educational handout given for calorie counting  The above assessment and management plan was discussed with the patient. The patient verbalized understanding of and has agreed to the management plan. Patient is aware to call the clinic if they develop any new symptoms or if symptoms persist or worsen. Patient is aware when to return to the clinic for a follow-up visit. Patient educated on when it is appropriate to go to the emergency department.   MMonia Pouch FNP-C [facility] [phone]

## 2022-01-19 ENCOUNTER — Encounter: Payer: Self-pay | Admitting: Family Medicine

## 2022-01-19 LAB — LIPID PANEL
Chol/HDL Ratio: 3.4 ratio (ref 0.0–4.4)
Cholesterol, Total: 157 mg/dL (ref 100–199)
HDL: 46 mg/dL (ref >39)
LDL Chol Calc (NIH): 85 mg/dL (ref 0–99)
Triglycerides: 152 mg/dL — ABNORMAL HIGH (ref 0–149)
VLDL Cholesterol Cal: 26 mg/dL (ref 5–40)

## 2022-01-19 LAB — CBC WITH DIFFERENTIAL/PLATELET
Basophils Absolute: 0 10*3/uL (ref 0.0–0.2)
Basos: 0 %
EOS (ABSOLUTE): 0.2 10*3/uL (ref 0.0–0.4)
Eos: 3 %
Hematocrit: 45.7 % (ref 34.0–46.6)
Hemoglobin: 15.5 g/dL (ref 11.1–15.9)
Immature Grans (Abs): 0 10*3/uL (ref 0.0–0.1)
Immature Granulocytes: 0 %
Lymphocytes Absolute: 3.5 10*3/uL — ABNORMAL HIGH (ref 0.7–3.1)
Lymphs: 39 %
MCH: 31.1 pg (ref 26.6–33.0)
MCHC: 33.9 g/dL (ref 31.5–35.7)
MCV: 92 fL (ref 79–97)
Monocytes Absolute: 0.5 10*3/uL (ref 0.1–0.9)
Monocytes: 6 %
Neutrophils Absolute: 4.6 10*3/uL (ref 1.4–7.0)
Neutrophils: 52 %
Platelets: 334 10*3/uL (ref 150–450)
RBC: 4.98 x10E6/uL (ref 3.77–5.28)
RDW: 13 % (ref 11.7–15.4)
WBC: 8.8 10*3/uL (ref 3.4–10.8)

## 2022-01-19 LAB — URINALYSIS
Bilirubin, UA: NEGATIVE
Glucose, UA: NEGATIVE
Ketones, UA: NEGATIVE
Leukocytes,UA: NEGATIVE
Nitrite, UA: NEGATIVE
Protein,UA: NEGATIVE
RBC, UA: NEGATIVE
Specific Gravity, UA: 1.01 (ref 1.005–1.030)
Urobilinogen, Ur: 0.2 mg/dL (ref 0.2–1.0)
pH, UA: 5.5 (ref 5.0–7.5)

## 2022-01-19 LAB — CMP14+EGFR
ALT: 35 IU/L — ABNORMAL HIGH (ref 0–32)
AST: 20 IU/L (ref 0–40)
Albumin/Globulin Ratio: 1.6 (ref 1.2–2.2)
Albumin: 4.6 g/dL (ref 3.9–4.9)
Alkaline Phosphatase: 109 IU/L (ref 44–121)
BUN/Creatinine Ratio: 12 (ref 9–23)
BUN: 10 mg/dL (ref 6–24)
Bilirubin Total: 0.4 mg/dL (ref 0.0–1.2)
CO2: 24 mmol/L (ref 20–29)
Calcium: 9.8 mg/dL (ref 8.7–10.2)
Chloride: 100 mmol/L (ref 96–106)
Creatinine, Ser: 0.85 mg/dL (ref 0.57–1.00)
Globulin, Total: 2.8 g/dL (ref 1.5–4.5)
Glucose: 95 mg/dL (ref 70–99)
Potassium: 3.9 mmol/L (ref 3.5–5.2)
Sodium: 140 mmol/L (ref 134–144)
Total Protein: 7.4 g/dL (ref 6.0–8.5)
eGFR: 84 mL/min/{1.73_m2} (ref >59)

## 2022-01-19 LAB — THYROID PANEL WITH TSH
Free Thyroxine Index: 1.7 (ref 1.2–4.9)
T3 Uptake Ratio: 23 % — ABNORMAL LOW (ref 24–39)
T4, Total: 7.2 ug/dL (ref 4.5–12.0)
TSH: 1.55 u[IU]/mL (ref 0.450–4.500)

## 2022-01-20 ENCOUNTER — Other Ambulatory Visit: Payer: Self-pay | Admitting: Family Medicine

## 2022-01-20 DIAGNOSIS — Z90710 Acquired absence of both cervix and uterus: Secondary | ICD-10-CM

## 2022-01-25 ENCOUNTER — Other Ambulatory Visit: Payer: Self-pay | Admitting: Family Medicine

## 2022-01-25 DIAGNOSIS — Z6835 Body mass index (BMI) 35.0-35.9, adult: Secondary | ICD-10-CM

## 2022-01-25 DIAGNOSIS — Z7689 Persons encountering health services in other specified circumstances: Secondary | ICD-10-CM

## 2022-02-04 ENCOUNTER — Encounter: Payer: Self-pay | Admitting: Orthopedic Surgery

## 2022-02-11 ENCOUNTER — Other Ambulatory Visit: Payer: Self-pay | Admitting: Family Medicine

## 2022-02-11 DIAGNOSIS — F320 Major depressive disorder, single episode, mild: Secondary | ICD-10-CM

## 2022-02-11 DIAGNOSIS — Z90710 Acquired absence of both cervix and uterus: Secondary | ICD-10-CM

## 2022-02-11 DIAGNOSIS — F411 Generalized anxiety disorder: Secondary | ICD-10-CM

## 2022-02-17 ENCOUNTER — Encounter: Payer: Self-pay | Admitting: Family Medicine

## 2022-02-17 ENCOUNTER — Other Ambulatory Visit: Payer: Self-pay | Admitting: Family Medicine

## 2022-02-17 DIAGNOSIS — M62830 Muscle spasm of back: Secondary | ICD-10-CM

## 2022-03-10 ENCOUNTER — Encounter: Payer: Self-pay | Admitting: Family Medicine

## 2022-03-11 ENCOUNTER — Ambulatory Visit: Payer: BC Managed Care – PPO | Admitting: Family Medicine

## 2022-03-16 ENCOUNTER — Ambulatory Visit: Payer: BC Managed Care – PPO | Admitting: Family Medicine

## 2022-03-22 NOTE — Pre-Procedure Instructions (Signed)
Surgical Instructions    Your procedure is scheduled on Tuesday December 19.  Report to [facility] Main Entrance "A" at 5:30 A.M., then check in with the Admitting office.  Call this number if you have problems the morning of surgery:  [phone]  If you have any questions prior to your surgery date call [phone]: Open Monday-Friday 8am-4pm  If you experience any cold, Covid, or flu symptoms such as cough, fever, chills, shortness of breath, etc. between now and your scheduled surgery, please notify us at the above number   Patient Instructions   The night before surgery:    No food after midnight. ONLY clear liquids after midnight   The day of surgery (if you do NOT have diabetes):    You may drink clear liquids until 4:30 the morning of your surgery.   Clear liquids allowed are: Water, Non-Citrus Juices (without pulp), Carbonated Beverages, Clear Tea, Black Coffee ONLY (NO MILK, CREAM OR POWDERED CREAMER of any kind), and Gatorade  Drink ONE (1) Pre-Surgery Clear Ensure by 4:30 the morning of surgery. Drink in one sitting. Do not sip.  This drink was given to you during your hospital pre-op appointment visit.   Nothing else to drink after completing the Pre-Surgery Clear Ensure.         If you have questions, please contact your surgeon's office.    Take these medications the morning of surgery with A SIP OF WATER:  doxepin (SINEQUAN)  fluticasone (FLONASE)   As of today, STOP taking any Aspirin (unless otherwise instructed by your surgeon) Aleve, Naproxen, Ibuprofen, Motrin, Advil, Goody's, BC's, all herbal medications, fish oil, and all vitamins.          Do NOT Smoke (Tobacco/Vaping)  24 hours prior to your procedure  If you use a CPAP at night, you may bring your mask for your overnight stay.   Contacts, glasses, hearing aids, dentures or partials may not be worn into surgery, please bring cases for these belongings   For patients admitted to the hospital,  discharge time will be determined by your treatment team.   Patients discharged the day of surgery will not be allowed to drive home, and someone needs to stay with them for 24 hours.  SURGICAL WAITING ROOM VISITATION Patients having surgery or a procedure may have no more than 2 support people in the waiting area - these visitors may rotate.   Children under the age of 65 must have an adult with them who is not the patient. If the patient needs to stay at the hospital during part of their recovery, the visitor guidelines for inpatient rooms apply. Pre-op nurse will coordinate an appropriate time for 1 support person to accompany patient in pre-op.  This support person may not rotate.   Please refer to the [facility] website for the visitor guidelines for Inpatients (after your surgery is over and you are in a regular room).    Oral Hygiene is also important to reduce your risk of infection.  Remember - BRUSH YOUR TEETH THE MORNING OF SURGERY WITH YOUR REGULAR TOOTHPASTE  [place]- Preparing for Total Shoulder Arthroplasty  Before surgery, you can play an important role. Because skin is not sterile, your skin needs to be as free of germs as possible. You can reduce the number of germs on your skin by using the following products.   Benzoyl Peroxide Gel  o Reduces the number of germs present on the skin  o Applied twice a day  to shoulder area starting two days before surgery   Chlorhexidine Gluconate (CHG) Soap (instructions listed above on how to wash with CHG Soap)  o An antiseptic cleaner that kills germs and bonds with the skin to continue killing germs even after washing  o Used for showering the night before surgery and morning of surgery  ==================================================================  Please follow these instructions carefully:  BENZOYL PEROXIDE 5% GEL  Please do not use if you have an allergy to benzoyl peroxide. If your skin becomes  reddened/irritated stop using the benzoyl peroxide.  Starting two days before surgery, apply as follows:  1. Apply benzoyl peroxide in the morning and at night. Apply after taking a shower. If you are not taking a shower clean entire shoulder front, back, and side along with the armpit with a clean wet washcloth.  2. Place a quarter-sized dollop on your SHOULDER and rub in thoroughly, making sure to cover the front, back, and side of your shoulder, along with the armpit.   2 Days prior to Surgery First Dose on Sunday Morning Second Dose on Sunday Night  Day Before Surgery First Dose on Monday Morning Night before surgery wash (entire body except face and private areas) with CHG Soap THEN Second Dose on Monday Night   Morning of Surgery  wash BODY AGAIN with CHG Soap  4. Do NOT apply benzoyl peroxide gel on the day of surgery   [place]- Preparing For Surgery  Before surgery, you can play an important role. Because skin is not sterile, your skin needs to be as free of germs as possible. You can reduce the number of germs on your skin by washing with CHG (chlorahexidine gluconate) Soap before surgery.  CHG is an antiseptic cleaner which kills germs and bonds with the skin to continue killing germs even after washing.     Please do not use if you have an allergy to CHG or antibacterial soaps. If your skin becomes reddened/irritated stop using the CHG.  Do not shave (including legs and underarms) for at least 48 hours prior to first CHG shower. It is OK to shave your face.  Please follow these instructions carefully.     Shower the NIGHT BEFORE SURGERY and the MORNING OF SURGERY with CHG Soap.   If you chose to wash your hair, wash your hair first as usual with your normal shampoo. After you shampoo, rinse your hair and body thoroughly to remove the shampoo.  Then [employer] and genitals (private parts) with your normal soap and rinse thoroughly to remove soap.  After that Use CHG  Soap as you would any other liquid soap. You can apply CHG directly to the skin and wash gently with a scrungie or a clean washcloth.   Apply the CHG Soap to your body ONLY FROM THE NECK DOWN.  Do not use on open wounds or open sores. Avoid contact with your eyes, ears, mouth and genitals (private parts). Wash Face and genitals (private parts)  with your normal soap.   Wash thoroughly, paying special attention to the area where your surgery will be performed.  Thoroughly rinse your body with warm water from the neck down.  DO NOT shower/wash with your normal soap after using and rinsing off the CHG Soap.  Pat yourself dry with a CLEAN TOWEL.  8. Apply the Benzoyl Peroxide only the night before surgery.  Do Not use it the morning of surgery.  Wear CLEAN PAJAMAS to bed the night before surgery  Place CLEAN SHEETS on your bed the night before your surgery  DO NOT SLEEP WITH PETS.  Day of Surgery:  Take a shower with CHG soap. Wear Clean/Comfortable clothing the morning of surgery Brush your teeth WITH YOUR REGULAR TOOTHPASTE. Do not wear jewelry or makeup. Do not wear lotions, powders, perfumes/cologne or deodorant. Do not shave 48 hours prior to surgery.  Men may shave face and neck. Do not bring valuables to the hospital.  [facility] is not responsible for any belongings or valuables.  Do not wear nail polish, gel polish, artificial nails, or any other type of covering on natural nails (fingers and toes) If you have artificial nails or gel coating that need to be removed by a nail salon, please have this removed prior to surgery. Artificial nails or gel coating may interfere with anesthesia's ability to adequately monitor your vital signs.    If you received a COVID test during your pre-op visit, it is requested that you wear a mask when out in public, stay away from anyone that may not be feeling well, and notify your surgeon if you develop symptoms. If you have been in contact  with anyone that has tested positive in the last 10 days, please notify your surgeon.    Please read over the following fact sheets that you were given.

## 2022-03-23 ENCOUNTER — Other Ambulatory Visit: Payer: Self-pay

## 2022-03-23 ENCOUNTER — Encounter (HOSPITAL_COMMUNITY)
Admission: RE | Admit: 2022-03-23 | Discharge: 2022-03-23 | Disposition: A | Discharge status: Home / Self Care | Payer: BC Managed Care – PPO | Source: Ambulatory Visit | Attending: Orthopedic Surgery | Admitting: Orthopedic Surgery

## 2022-03-23 ENCOUNTER — Encounter (HOSPITAL_COMMUNITY): Payer: Self-pay

## 2022-03-23 VITALS — BP 134/84 | HR 87 | Temp 97.8°F | Resp 17 | Ht 65.0 in | Wt 206.8 lb

## 2022-03-23 DIAGNOSIS — Z01812 Encounter for preprocedural laboratory examination: Secondary | ICD-10-CM | POA: Insufficient documentation

## 2022-03-23 DIAGNOSIS — E785 Hyperlipidemia, unspecified: Secondary | ICD-10-CM | POA: Diagnosis not present

## 2022-03-23 DIAGNOSIS — M7551 Bursitis of right shoulder: Secondary | ICD-10-CM | POA: Diagnosis not present

## 2022-03-23 DIAGNOSIS — Z01818 Encounter for other preprocedural examination: Secondary | ICD-10-CM

## 2022-03-23 LAB — CBC
HCT: 41.9 % (ref 36.0–46.0)
Hemoglobin: 14.2 g/dL (ref 12.0–15.0)
MCH: 31 pg (ref 26.0–34.0)
MCHC: 33.9 g/dL (ref 30.0–36.0)
MCV: 91.5 fL (ref 80.0–100.0)
Platelets: 319 10*3/uL (ref 150–400)
RBC: 4.58 MIL/uL (ref 3.87–5.11)
RDW: 12.9 % (ref 11.5–15.5)
WBC: 7.8 10*3/uL (ref 4.0–10.5)
nRBC: 0 % (ref 0.0–0.2)

## 2022-03-23 LAB — BASIC METABOLIC PANEL
Anion gap: 5 (ref 5–15)
BUN: 9 mg/dL (ref 6–20)
CO2: 28 mmol/L (ref 22–32)
Calcium: 9.3 mg/dL (ref 8.9–10.3)
Chloride: 108 mmol/L (ref 98–111)
Creatinine, Ser: 0.85 mg/dL (ref 0.44–1.00)
GFR, Estimated: >60 mL/min (ref >60)
Glucose, Bld: 115 mg/dL — ABNORMAL HIGH (ref 70–99)
Potassium: 3.5 mmol/L (ref 3.5–5.1)
Sodium: 141 mmol/L (ref 135–145)

## 2022-03-23 LAB — SURGICAL PCR SCREEN
MRSA, PCR: NEGATIVE
Staphylococcus aureus: NEGATIVE

## 2022-03-23 NOTE — Pre-Procedure Instructions (Addendum)
Surgical Instructions    Your procedure is scheduled on Tuesday December 19.  Report to [facility] Main Entrance "A" at 5:30 A.M., then check in with the Admitting office.  Call this number if you have problems the morning of surgery:  [phone]  If you have any questions prior to your surgery date call [phone]: Open Monday-Friday 8am-4pm  If you experience any cold, Covid, or flu symptoms such as cough, fever, chills, shortness of breath, etc. between now and your scheduled surgery, please notify us at the above number   Patient Instructions   The night before surgery:    No food after midnight. ONLY clear liquids after midnight   The day of surgery (if you do NOT have diabetes):    You may drink clear liquids until 4:30 the morning of your surgery.   Clear liquids allowed are: Water, Non-Citrus Juices (without pulp), Carbonated Beverages, Clear Tea, Black Coffee ONLY (NO MILK, CREAM OR POWDERED CREAMER of any kind), and Gatorade  Drink ONE (1) Pre-Surgery Clear Ensure by 4:30 the morning of surgery. Drink in one sitting. Do not sip.  This drink was given to you during your hospital pre-op appointment visit.   Nothing else to drink after completing the Pre-Surgery Clear Ensure.         If you have questions, please contact your surgeon's office.    Take these medications the morning of surgery with A SIP OF WATER:  fluticasone (FLONASE)   As of today, STOP taking any Aspirin (unless otherwise instructed by your surgeon) Aleve, Naproxen, Ibuprofen, Motrin, Advil, Goody's, BC's, all herbal medications, fish oil, and all vitamins.          Do NOT Smoke (Tobacco/Vaping)  24 hours prior to your procedure  If you use a CPAP at night, you may bring your mask for your overnight stay.   Contacts, glasses, hearing aids, dentures or partials may not be worn into surgery, please bring cases for these belongings   For patients admitted to the hospital, discharge time will be  determined by your treatment team.   Patients discharged the day of surgery will not be allowed to drive home, and someone needs to stay with them for 24 hours.  SURGICAL WAITING ROOM VISITATION Patients having surgery or a procedure may have no more than 2 support people in the waiting area - these visitors may rotate.   Children under the age of 20 must have an adult with them who is not the patient. If the patient needs to stay at the hospital during part of their recovery, the visitor guidelines for inpatient rooms apply. Pre-op nurse will coordinate an appropriate time for 1 support person to accompany patient in pre-op.  This support person may not rotate.   Please refer to the [facility] website for the visitor guidelines for Inpatients (after your surgery is over and you are in a regular room).    Oral Hygiene is also important to reduce your risk of infection.  Remember - BRUSH YOUR TEETH THE MORNING OF SURGERY WITH YOUR REGULAR TOOTHPASTE   [facility]- Preparing For Surgery  Before surgery, you can play an important role. Because skin is not sterile, your skin needs to be as free of germs as possible. You can reduce the number of germs on your skin by washing with CHG (chlorahexidine gluconate) Soap before surgery.  CHG is an antiseptic cleaner which kills germs and bonds with the skin to continue killing germs even after washing.  Please do not use if you have an allergy to CHG or antibacterial soaps. If your skin becomes reddened/irritated stop using the CHG.  Do not shave (including legs and underarms) for at least 48 hours prior to first CHG shower. It is OK to shave your face.  Please follow these instructions carefully.     Shower the NIGHT BEFORE SURGERY and the MORNING OF SURGERY with CHG Soap.   If you chose to wash your hair, wash your hair first as usual with your normal shampoo. After you shampoo, rinse your hair and body thoroughly to remove the shampoo.  Then  [employer] and genitals (private parts) with your normal soap and rinse thoroughly to remove soap.  After that Use CHG Soap as you would any other liquid soap. You can apply CHG directly to the skin and wash gently with a scrungie or a clean washcloth.   Apply the CHG Soap to your body ONLY FROM THE NECK DOWN.  Do not use on open wounds or open sores. Avoid contact with your eyes, ears, mouth and genitals (private parts). Wash Face and genitals (private parts)  with your normal soap.   Wash thoroughly, paying special attention to the area where your surgery will be performed.  Thoroughly rinse your body with warm water from the neck down.  DO NOT shower/wash with your normal soap after using and rinsing off the CHG Soap.  Pat yourself dry with a CLEAN TOWEL.  Wear CLEAN PAJAMAS to bed the night before surgery  Place CLEAN SHEETS on your bed the night before your surgery  DO NOT SLEEP WITH PETS.  Day of Surgery:  Take a shower with CHG soap. Wear Clean/Comfortable clothing the morning of surgery Brush your teeth WITH YOUR REGULAR TOOTHPASTE. Do not wear jewelry or makeup. Do not wear lotions, powders, perfumes/cologne or deodorant. Do not shave 48 hours prior to surgery.  Men may shave face and neck. Do not bring valuables to the hospital.  [facility] is not responsible for any belongings or valuables.  Do not wear nail polish, gel polish, artificial nails, or any other type of covering on natural nails (fingers and toes) If you have artificial nails or gel coating that need to be removed by a nail salon, please have this removed prior to surgery. Artificial nails or gel coating may interfere with anesthesia's ability to adequately monitor your vital signs.    If you received a COVID test during your pre-op visit, it is requested that you wear a mask when out in public, stay away from anyone that may not be feeling well, and notify your surgeon if you develop symptoms. If you have  been in contact with anyone that has tested positive in the last 10 days, please notify your surgeon.    Please read over the following fact sheets that you were given.

## 2022-03-23 NOTE — Progress Notes (Signed)
PCP - Gilford Silvius FNP Cardiologist - denies  PPM/ICD - denies Device Orders -  Rep Notified -   Chest x-ray - none EKG - none Stress Test - denies ECHO - denies Cardiac Cath - denies  Sleep Study - denies CPAP -   Fasting Blood Sugar - na Checks Blood Sugar _____ times a day  Last dose of GLP1 agonist-  pt has not taken Wegovy in more than a month and will not resume taking it.  GLP1 instructions:   Blood Thinner Instructions:na Aspirin Instructions:na  ERAS Protcol -clear liquids until 0430 PRE-SURGERY Ensure or G2- Ensure  COVID TEST- na   Anesthesia review: yes-Pt reports history of difficult intubation. She was seen by Shonna Chock PA in PAT. Anesthesia report from 11/09/11 requested from [facility] in [place].  Patient denies shortness of breath, fever, cough and chest pain at PAT appointment   All instructions explained to the patient, with a verbal understanding of the material. Patient agrees to go over the instructions while at home for a better understanding. Patient also instructed to wear a mask when out in public prior to surgery. The opportunity to ask questions was provided.

## 2022-03-23 NOTE — Progress Notes (Signed)
Anesthesia APP Note:  Case: [id number] Date/Time: 04/19/22 0715   Procedure: RIGHT SHOULDER ARTHROSCOPY, SUBACROMIAL DECOMPRESSION, POSSIBLE  BICEPS TENODESIS AND DEBRIDEMENT (Right)   Anesthesia type: General   Pre-op diagnosis: right shoulder bursitis, biceps tendonitis   Location: [facility] OR ROOM 06 / [facility]   Surgeons: Meredith Pel, MD       DISCUSSION: Patient is a 51 year old female scheduled for the above procedure.  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 BMI is consistent with obesity.   She moved from [place] to [facility] around August 2022   She reported DIFFICULT INTUBATION for 2013 hysterectomy. She says she was not told it was difficult at the time, but reportedly anesthesiologist who evaluated her prior to 10/29/19 left oophorectomy said he reviewed the records and there were several attempts made before she was successfully intubated. She said he told her due to her history, he would elect to use another technique to intubate her. She denied history of awake intubation. She denied difficulty with neck mobility or mouth opening. Denied loose teeth. Exam showed Mallampati II, mouth opening < 3 FB. Will attempt to get 2013 anesthesia records, but after evaluating her in PAT, I was able to locate [facility] records in [place]. According to 10/29/19 Anesthesia Event Summary: - Per Anesthesia Preprocedure Evaluation by Sela Hilding, MD,  "(+) previous surgery or anesthesia difficult intubation (EZ; Airway Grade: IV; With: BURP; Successful Technique: video scope (DL x 1 Mac3 > no view, DL x 1 Miller 2 > esoph f); Laryngoscope Blade Size: (Glidescope); Attempts: 3... Plan asleep videoscope due to airway exam and prior"  -  Per 10/29/19 Anesthesia Airway Placement Note: Rapid Sequence Induction: yes Mask ventilation:  N/A External maneuver: elevated head of bed Successful technique: Mac Laryngoscope blade size:  3 Airway grade: 2a (Partial view of glottis) Other equipment: stylette Attempts: 1 Airway type: endotracheal Size: 7 Cuffed: cuffed Route, reference point: right side of mouth Tube depth: 21 cm Tube secured with: adhesive tape Trauma: none Tube placement verification: carbon dioxide detection and video laryngoscope Performing provider: Sela Hilding, MD Comments: Elective McGrath Mac 3 Grade 1 view on screen, Grade IIa via mouth   She is not currently taking Wegovy and says she will not plan to take between now and surgery.    VS: BP 134/84   Pulse 87   Temp 36.6 C   Resp 17   Ht _0  (1.651 m)   Wt 93.8 kg   LMP 10/30/2018 (Approximate) Comment: total hysterectmy  SpO2 100%   BMI 34.41 kg/m  Heart RRR, no murmur note. Lung clear. No ankle edema. Airway exam as above. She denied chest pain, SOB, edema, syncope. Reports good exercise tolerance and being able to climb 1-2 flight of stairs, clean and vacuum her house.    PROVIDERS: Baruch Gouty, FNP is PCP ([facility])   LABS: Labs reviewed: Acceptable for surgery. (all labs ordered are listed, but only abnormal results are displayed)  Labs Reviewed  BASIC METABOLIC PANEL - Abnormal; Notable for the following components:      Result Value   Glucose, Bld 115 (*)    All other components within normal limits  SURGICAL PCR SCREEN  CBC     IMAGES: MRI Right Shoulder 10/11/21: IMPRESSION: 1. Minimal tendinosis and  degenerative fraying of the bursal aspect of the anterior infraspinatus tendon footprint. 2. Mild partial-thickness tearing of the articular side of the superior subscapularis tendon. 3. Probable mild focal partial-thickness tear of the distal aspect of the horizontal portion of the  proximal long head of the biceps tendon. 4. Mild degenerative changes of the acromioclavicular joint. 5. Mild anterior inferior glenoid cartilage degenerative changes with subchondral degenerative cystic change. Minimal peripheral labral degenerative irregularity in this region, but otherwise no significant labral tear.   EKG: N/A   CV: N/A  Past Medical History:  Diagnosis Date   Anxiety    Difficult intubation 11/09/2011   uneventful intubation using elective McGrath MAC 3 video laryngoscope 10/29/19 given prior history   Hyperlipidemia    Insomnia     Past Surgical History:  Procedure Laterality Date   ABDOMINAL HYSTERECTOMY  11/09/2011   hysterectomy with right ooporectomy   BREAST EXCISIONAL BIOPSY Left    OOPHORECTOMY Left 10/29/2019   right ooporectomy 11/09/11; left oophorectoy 10/29/19 for ovarian torsion   TONSILLECTOMY Bilateral     MEDICATIONS:  atorvastatin (LIPITOR) 20 MG tablet   buPROPion (WELLBUTRIN XL) 150 MG 24 hr tablet   doxepin (SINEQUAN) 10 MG capsule   doxepin (SINEQUAN) 25 MG capsule   estradiol (ESTRACE) 0.5 MG tablet   fluticasone (FLONASE) 50 MCG/ACT nasal spray   naproxen sodium (ALEVE) 220 MG tablet   omeprazole (PRILOSEC) 20 MG capsule   ondansetron (ZOFRAN) 4 MG tablet   WEGOVY 2.4 MG/0.75ML SOAJ   No current facility-administered medications for this encounter.    Myra Gianotti, PA-C Surgical Short Stay/Anesthesiology [facility] Phone [phone] [facility] Phone [phone] 03/23/2022 4:23 PM

## 2022-03-23 NOTE — Anesthesia Preprocedure Evaluation (Addendum)
Anesthesia Evaluation  Patient identified by MRN, date of birth, ID band Patient awake    Reviewed: Allergy & Precautions, NPO status , Patient's Chart, lab work & pertinent test results  History of Anesthesia Complications (+) DIFFICULT AIRWAY and history of anesthetic complications (Last airway note: Grade 1 view with McGrath)  Airway Mallampati: III  TM Distance: >3 FB Neck ROM: Full  Mouth opening: Limited Mouth Opening  Dental  (+) Teeth Intact, Dental Advisory Given   Pulmonary neg pulmonary ROS   Pulmonary exam normal breath sounds clear to auscultation       Cardiovascular negative cardio ROS Normal cardiovascular exam Rhythm:Regular Rate:Normal  HLD   Neuro/Psych  PSYCHIATRIC DISORDERS Anxiety Depression    negative neurological ROS     GI/Hepatic Neg liver ROS,GERD  ,,  Endo/Other  negative endocrine ROS    Renal/GU negative Renal ROS  negative genitourinary   Musculoskeletal negative musculoskeletal ROS (+)    Abdominal   Peds  Hematology negative hematology ROS (+)   Anesthesia Other Findings   Reproductive/Obstetrics                             Anesthesia Physical Anesthesia Plan  ASA: 2  Anesthesia Plan: General and Regional   Post-op Pain Management: Regional block* and Tylenol PO (pre-op)*   Induction: Intravenous  PONV Risk Score and Plan: 3 and Midazolam, Dexamethasone and Ondansetron  Airway Management Planned: Oral ETT and Video Laryngoscope Planned  Additional Equipment:   Intra-op Plan:   Post-operative Plan: Extubation in OR  Informed Consent: I have reviewed the patients History and Physical, chart, labs and discussed the procedure including the risks, benefits and alternatives for the proposed anesthesia with the patient or authorized representative who has indicated his/her understanding and acceptance.     Dental advisory given  Plan Discussed  with: CRNA  Anesthesia Plan Comments:        Anesthesia Quick Evaluation

## 2022-04-19 ENCOUNTER — Other Ambulatory Visit: Payer: Self-pay

## 2022-04-19 ENCOUNTER — Encounter: Payer: Self-pay | Admitting: Orthopedic Surgery

## 2022-04-19 ENCOUNTER — Ambulatory Visit (HOSPITAL_COMMUNITY): Payer: BC Managed Care – PPO | Admitting: Vascular Surgery

## 2022-04-19 ENCOUNTER — Encounter (HOSPITAL_COMMUNITY): Payer: Self-pay | Admitting: Orthopedic Surgery

## 2022-04-19 ENCOUNTER — Ambulatory Visit (HOSPITAL_COMMUNITY): Payer: BC Managed Care – PPO | Admitting: Certified Registered"

## 2022-04-19 ENCOUNTER — Encounter (HOSPITAL_COMMUNITY)
Admission: RE | Disposition: A | Discharge status: Home / Self Care | Payer: Self-pay | Source: Ambulatory Visit | Attending: Orthopedic Surgery

## 2022-04-19 ENCOUNTER — Ambulatory Visit (HOSPITAL_COMMUNITY)
Admission: RE | Admit: 2022-04-19 | Discharge: 2022-04-19 | Disposition: A | Discharge status: Home / Self Care | Payer: BC Managed Care – PPO | Source: Ambulatory Visit | Attending: Orthopedic Surgery | Admitting: Orthopedic Surgery

## 2022-04-19 DIAGNOSIS — M7551 Bursitis of right shoulder: Secondary | ICD-10-CM | POA: Diagnosis present

## 2022-04-19 DIAGNOSIS — M7521 Bicipital tendinitis, right shoulder: Secondary | ICD-10-CM | POA: Diagnosis not present

## 2022-04-19 DIAGNOSIS — X58XXXA Exposure to other specified factors, initial encounter: Secondary | ICD-10-CM | POA: Insufficient documentation

## 2022-04-19 DIAGNOSIS — E785 Hyperlipidemia, unspecified: Secondary | ICD-10-CM | POA: Diagnosis not present

## 2022-04-19 DIAGNOSIS — K219 Gastro-esophageal reflux disease without esophagitis: Secondary | ICD-10-CM | POA: Insufficient documentation

## 2022-04-19 DIAGNOSIS — Y939 Activity, unspecified: Secondary | ICD-10-CM | POA: Diagnosis not present

## 2022-04-19 DIAGNOSIS — F32A Depression, unspecified: Secondary | ICD-10-CM | POA: Insufficient documentation

## 2022-04-19 DIAGNOSIS — M19011 Primary osteoarthritis, right shoulder: Secondary | ICD-10-CM | POA: Insufficient documentation

## 2022-04-19 DIAGNOSIS — S43431A Superior glenoid labrum lesion of right shoulder, initial encounter: Secondary | ICD-10-CM | POA: Diagnosis not present

## 2022-04-19 DIAGNOSIS — F419 Anxiety disorder, unspecified: Secondary | ICD-10-CM | POA: Insufficient documentation

## 2022-04-19 DIAGNOSIS — Z01818 Encounter for other preprocedural examination: Secondary | ICD-10-CM

## 2022-04-19 HISTORY — PX: SHOULDER ARTHROSCOPY WITH SUBACROMIAL DECOMPRESSION AND BICEP TENDON REPAIR: SHX5689

## 2022-04-19 SURGERY — SHOULDER ARTHROSCOPY WITH SUBACROMIAL DECOMPRESSION AND BICEP TENDON REPAIR
Anesthesia: Regional | Laterality: Right

## 2022-04-19 MED ORDER — POVIDONE-IODINE 7.5 % EX SOLN: Freq: Once | CUTANEOUS | Status: DC

## 2022-04-19 MED ORDER — DEXAMETHASONE SODIUM PHOSPHATE 10 MG/ML IJ SOLN
INTRAMUSCULAR | Status: DC | PRN
  Administered 2022-04-19: 10 mg via INTRAVENOUS

## 2022-04-19 MED ORDER — METHOCARBAMOL 500 MG PO TABS: 500.0000 mg | ORAL_TABLET | Freq: Three times a day (TID) | ORAL | 1 refills | Status: DC | PRN

## 2022-04-19 MED ORDER — BUPIVACAINE LIPOSOME 1.3 % IJ SUSP
INTRAMUSCULAR | Status: DC | PRN
  Administered 2022-04-19: 10 mL via PERINEURAL

## 2022-04-19 MED ORDER — ONDANSETRON HCL 4 MG/2ML IJ SOLN
INTRAMUSCULAR | Status: DC | PRN
  Administered 2022-04-19: 4 mg via INTRAVENOUS

## 2022-04-19 MED ORDER — EPINEPHRINE PF 1 MG/ML IJ SOLN
INTRAMUSCULAR | Status: AC
  Filled 2022-04-19: qty 1

## 2022-04-19 MED ORDER — CHLORHEXIDINE GLUCONATE 0.12 % MT SOLN
15.0000 mL | Freq: Once | OROMUCOSAL | Status: AC
  Administered 2022-04-19: 15 mL via OROMUCOSAL
  Filled 2022-04-19: qty 15

## 2022-04-19 MED ORDER — ONDANSETRON HCL 4 MG/2ML IJ SOLN
INTRAMUSCULAR | Status: AC
  Filled 2022-04-19: qty 2

## 2022-04-19 MED ORDER — BUPIVACAINE HCL (PF) 0.5 % IJ SOLN
INTRAMUSCULAR | Status: DC | PRN
  Administered 2022-04-19: 15 mL via PERINEURAL

## 2022-04-19 MED ORDER — POVIDONE-IODINE 10 % EX SWAB
2.0000 | Freq: Once | CUTANEOUS | Status: AC
  Administered 2022-04-19: 2 via TOPICAL

## 2022-04-19 MED ORDER — EPINEPHRINE PF 1 MG/ML IJ SOLN
INTRAMUSCULAR | Status: AC
  Filled 2022-04-19: qty 10

## 2022-04-19 MED ORDER — DEXAMETHASONE SODIUM PHOSPHATE 10 MG/ML IJ SOLN
INTRAMUSCULAR | Status: AC
  Filled 2022-04-19: qty 1

## 2022-04-19 MED ORDER — AMISULPRIDE (ANTIEMETIC) 5 MG/2ML IV SOLN
10.0000 mg | Freq: Once | INTRAVENOUS | Status: AC
  Administered 2022-04-19: 10 mg via INTRAVENOUS

## 2022-04-19 MED ORDER — MIDAZOLAM HCL 2 MG/2ML IJ SOLN
INTRAMUSCULAR | Status: DC | PRN
  Administered 2022-04-19: 2 mg via INTRAVENOUS

## 2022-04-19 MED ORDER — SODIUM CHLORIDE 0.9 % IR SOLN
Status: DC | PRN
  Administered 2022-04-19: 6000 mL

## 2022-04-19 MED ORDER — PHENYLEPHRINE HCL-NACL 20-0.9 MG/250ML-% IV SOLN
INTRAVENOUS | Status: AC
  Filled 2022-04-19: qty 250

## 2022-04-19 MED ORDER — AMISULPRIDE (ANTIEMETIC) 5 MG/2ML IV SOLN
INTRAVENOUS | Status: AC
  Filled 2022-04-19: qty 4

## 2022-04-19 MED ORDER — PHENYLEPHRINE 80 MCG/ML (10ML) SYRINGE FOR IV PUSH (FOR BLOOD PRESSURE SUPPORT)
PREFILLED_SYRINGE | INTRAVENOUS | Status: AC
  Filled 2022-04-19: qty 10

## 2022-04-19 MED ORDER — ORAL CARE MOUTH RINSE: 15.0000 mL | Freq: Once | OROMUCOSAL | Status: AC

## 2022-04-19 MED ORDER — ROCURONIUM BROMIDE 10 MG/ML (PF) SYRINGE
PREFILLED_SYRINGE | INTRAVENOUS | Status: DC | PRN
  Administered 2022-04-19: 100 mg via INTRAVENOUS

## 2022-04-19 MED ORDER — CLONIDINE HCL (ANALGESIA) 100 MCG/ML EP SOLN
EPIDURAL | Status: AC
  Filled 2022-04-19: qty 10

## 2022-04-19 MED ORDER — CEFAZOLIN SODIUM-DEXTROSE 2-4 GM/100ML-% IV SOLN
2.0000 g | INTRAVENOUS | Status: AC
  Administered 2022-04-19: 2 g via INTRAVENOUS
  Filled 2022-04-19: qty 100

## 2022-04-19 MED ORDER — TRANEXAMIC ACID-NACL 1000-0.7 MG/100ML-% IV SOLN
1000.0000 mg | INTRAVENOUS | Status: AC
  Administered 2022-04-19: 1000 mg via INTRAVENOUS
  Filled 2022-04-19: qty 100

## 2022-04-19 MED ORDER — MORPHINE SULFATE (PF) 4 MG/ML IV SOLN
INTRAVENOUS | Status: AC
  Filled 2022-04-19: qty 1

## 2022-04-19 MED ORDER — EPINEPHRINE PF 1 MG/ML IJ SOLN
INTRAMUSCULAR | Status: DC | PRN
  Administered 2022-04-19: .15 mg via INTRAMUSCULAR

## 2022-04-19 MED ORDER — ACETAMINOPHEN 500 MG PO TABS
1000.0000 mg | ORAL_TABLET | Freq: Once | ORAL | Status: AC
  Administered 2022-04-19: 1000 mg via ORAL
  Filled 2022-04-19: qty 2

## 2022-04-19 MED ORDER — PROPOFOL 10 MG/ML IV BOLUS
INTRAVENOUS | Status: DC | PRN
  Administered 2022-04-19: 150 mg via INTRAVENOUS

## 2022-04-19 MED ORDER — FENTANYL CITRATE (PF) 250 MCG/5ML IJ SOLN
INTRAMUSCULAR | Status: AC
  Filled 2022-04-19: qty 5

## 2022-04-19 MED ORDER — LACTATED RINGERS IV SOLN: INTRAVENOUS | Status: DC

## 2022-04-19 MED ORDER — PHENYLEPHRINE HCL-NACL 20-0.9 MG/250ML-% IV SOLN
INTRAVENOUS | Status: DC | PRN
  Administered 2022-04-19: 25 ug/min via INTRAVENOUS

## 2022-04-19 MED ORDER — SUGAMMADEX SODIUM 500 MG/5ML IV SOLN
INTRAVENOUS | Status: AC
  Filled 2022-04-19: qty 5

## 2022-04-19 MED ORDER — MIDAZOLAM HCL 2 MG/2ML IJ SOLN
INTRAMUSCULAR | Status: AC
  Filled 2022-04-19: qty 2

## 2022-04-19 MED ORDER — ROCURONIUM BROMIDE 10 MG/ML (PF) SYRINGE
PREFILLED_SYRINGE | INTRAVENOUS | Status: AC
  Filled 2022-04-19: qty 10

## 2022-04-19 MED ORDER — PHENYLEPHRINE 80 MCG/ML (10ML) SYRINGE FOR IV PUSH (FOR BLOOD PRESSURE SUPPORT)
PREFILLED_SYRINGE | INTRAVENOUS | Status: DC | PRN
  Administered 2022-04-19 (×3): 80 ug via INTRAVENOUS

## 2022-04-19 MED ORDER — LIDOCAINE 2% (20 MG/ML) 5 ML SYRINGE
INTRAMUSCULAR | Status: AC
  Filled 2022-04-19: qty 5

## 2022-04-19 MED ORDER — PROPOFOL 10 MG/ML IV BOLUS
INTRAVENOUS | Status: AC
  Filled 2022-04-19: qty 20

## 2022-04-19 MED ORDER — FENTANYL CITRATE (PF) 100 MCG/2ML IJ SOLN: 25.0000 ug | INTRAMUSCULAR | Status: DC | PRN

## 2022-04-19 MED ORDER — BUPIVACAINE HCL (PF) 0.25 % IJ SOLN
INTRAMUSCULAR | Status: AC
  Filled 2022-04-19: qty 30

## 2022-04-19 MED ORDER — SUGAMMADEX SODIUM 200 MG/2ML IV SOLN
INTRAVENOUS | Status: DC | PRN
  Administered 2022-04-19: 200 mg via INTRAVENOUS

## 2022-04-19 MED ORDER — FENTANYL CITRATE (PF) 250 MCG/5ML IJ SOLN
INTRAMUSCULAR | Status: DC | PRN
  Administered 2022-04-19: 100 ug via INTRAVENOUS

## 2022-04-19 MED ORDER — OXYCODONE-ACETAMINOPHEN 5-325 MG PO TABS: 1.0000 | ORAL_TABLET | ORAL | 0 refills | Status: DC | PRN

## 2022-04-19 SURGICAL SUPPLY — 66 items
ANCHOR SUT 1.8 FIBERTAK SB KL (Anchor) IMPLANT
BAG COUNTER SPONGE SURGICOUNT (BAG) ×1 IMPLANT
BLADE CUTTER GATOR 3.5 (BLADE) IMPLANT
BLADE EXCALIBUR 4.0X13 (MISCELLANEOUS) IMPLANT
BLADE SHAVER TORPEDO 4X13 (MISCELLANEOUS) IMPLANT
BLADE SURG 11 STRL SS (BLADE) IMPLANT
BLADE SURG 15 STRL LF DISP TIS (BLADE) IMPLANT
BLADE SURG 15 STRL SS (BLADE) ×1
BUR OVAL 6.0 (BURR) IMPLANT
DRAPE INCISE IOBAN 66X45 STRL (DRAPES) ×1 IMPLANT
DRAPE STERI 35X30 U-POUCH (DRAPES) ×1 IMPLANT
DRAPE U-SHAPE 47X51 STRL (DRAPES) ×2 IMPLANT
DRSG TEGADERM 4X4.5 CHG (GAUZE/BANDAGES/DRESSINGS) IMPLANT
DRSG TEGADERM 4X4.75 (GAUZE/BANDAGES/DRESSINGS) ×4 IMPLANT
DRSG XEROFORM 1X8 (GAUZE/BANDAGES/DRESSINGS) IMPLANT
DURAPREP 26ML APPLICATOR (WOUND CARE) ×1 IMPLANT
ELECT REM PT RETURN 9FT ADLT (ELECTROSURGICAL) ×1
ELECTRODE REM PT RTRN 9FT ADLT (ELECTROSURGICAL) ×1 IMPLANT
FILTER STRAW FLUID ASPIR (MISCELLANEOUS) ×1 IMPLANT
GAUZE SPONGE 4X4 12PLY STRL (GAUZE/BANDAGES/DRESSINGS) ×1 IMPLANT
GAUZE XEROFORM 1X8 LF (GAUZE/BANDAGES/DRESSINGS) ×1 IMPLANT
GLOVE BIOGEL PI IND STRL 8 (GLOVE) ×1 IMPLANT
GLOVE ECLIPSE 7.0 STRL STRAW (GLOVE) ×1 IMPLANT
GLOVE ECLIPSE 8.0 STRL XLNG CF (GLOVE) ×1 IMPLANT
GLOVE INDICATOR 7.5 STRL GRN (GLOVE) ×1 IMPLANT
GOWN STRL REUS W/ TWL LRG LVL3 (GOWN DISPOSABLE) ×3 IMPLANT
GOWN STRL REUS W/TWL LRG LVL3 (GOWN DISPOSABLE) ×3
KIT BASIN OR (CUSTOM PROCEDURE TRAY) ×1 IMPLANT
KIT STR SPEAR 1.8 FBRTK DISP (KITS) IMPLANT
KIT TURNOVER KIT B (KITS) ×1 IMPLANT
MANIFOLD NEPTUNE II (INSTRUMENTS) ×1 IMPLANT
NDL HYPO 25X1 1.5 SAFETY (NEEDLE) IMPLANT
NDL SPNL 18GX3.5 QUINCKE PK (NEEDLE) ×1 IMPLANT
NDL SUT 6 .5 CRC .975X.05 MAYO (NEEDLE) ×1 IMPLANT
NEEDLE HYPO 25X1 1.5 SAFETY (NEEDLE) IMPLANT
NEEDLE MAYO TAPER (NEEDLE) ×1
NEEDLE SPNL 18GX3.5 QUINCKE PK (NEEDLE) ×1 IMPLANT
NS IRRIG 1000ML POUR BTL (IV SOLUTION) ×1 IMPLANT
PACK SHOULDER (CUSTOM PROCEDURE TRAY) ×1 IMPLANT
PAD ARMBOARD 7.5X6 YLW CONV (MISCELLANEOUS) ×2 IMPLANT
PORT APPOLLO RF 90DEGREE MULTI (SURGICAL WAND) IMPLANT
SLING ARM IMMOBILIZER LRG (SOFTGOODS) IMPLANT
SLING ARM IMMOBILIZER MED (SOFTGOODS) IMPLANT
SPONGE T-LAP 4X18 ~~LOC~~+RFID (SPONGE) ×2 IMPLANT
STRIP CLOSURE SKIN 1/2X4 (GAUZE/BANDAGES/DRESSINGS) IMPLANT
SUCTION FRAZIER HANDLE 10FR (MISCELLANEOUS)
SUCTION TUBE FRAZIER 10FR DISP (MISCELLANEOUS) IMPLANT
SUT ETHILON 3 0 PS 1 (SUTURE) ×1 IMPLANT
SUT FIBERWIRE 2-0 18 17.9 3/8 (SUTURE)
SUT VIC AB 0 CT1 27 (SUTURE) ×1
SUT VIC AB 0 CT1 27XBRD ANBCTR (SUTURE) ×1 IMPLANT
SUT VIC AB 1 CT1 27 (SUTURE) ×1
SUT VIC AB 1 CT1 27XBRD ANBCTR (SUTURE) IMPLANT
SUT VIC AB 2-0 CT1 27 (SUTURE) ×1
SUT VIC AB 2-0 CT1 TAPERPNT 27 (SUTURE) ×1 IMPLANT
SUT VICRYL 0 UR6 27IN ABS (SUTURE) ×1 IMPLANT
SUTURE FIBERWR 2-0 18 17.9 3/8 (SUTURE) IMPLANT
SYR 20ML LL LF (SYRINGE) ×1 IMPLANT
SYR 3ML LL SCALE MARK (SYRINGE) ×1 IMPLANT
SYR TB 1ML LUER SLIP (SYRINGE) ×1 IMPLANT
SYS FBRTK BUTTON 2.6 (Anchor) ×1 IMPLANT
SYSTEM FBRTK BUTTON 2.6 (Anchor) IMPLANT
TOWEL GREEN STERILE (TOWEL DISPOSABLE) ×1 IMPLANT
TOWEL GREEN STERILE FF (TOWEL DISPOSABLE) ×1 IMPLANT
TUBING ARTHROSCOPY IRRIG 16FT (MISCELLANEOUS) ×1 IMPLANT
WATER STERILE IRR 1000ML POUR (IV SOLUTION) ×1 IMPLANT

## 2022-04-19 NOTE — Op Note (Signed)
NAMESHARLETTE, [name] MEDICAL RECORD NO: [id number] ACCOUNT NO: [id number] DATE OF BIRTH: [birth year] FACILITY: [facility] LOCATION: [facility]-PER[facility] PHYSICIAN: Graylin Shiver. August Saucer, MD  Operative Report   DATE OF PROCEDURE: 04/19/2022   PREOPERATIVE DIAGNOSIS:  Right shoulder bursitis and possible biceps tendinitis.  POSTOPERATIVE DIAGNOSIS:  Right shoulder bursitis with degenerative type 2 SLAP tear and intact rotator cuff.  PROCEDURE:  Right shoulder arthroscopy with limited debridement of the superior labrum, biceps tendon release, mini open biceps tenodesis and subacromial decompression.  SURGEON:  Graylin Shiver. August Saucer, MD  ASSISTANT:  Karenann Cai, PA.  INDICATIONS:  This is a 51 year old patient with right shoulder pain, longstanding duration.  Not too much relief from intra-articular injection or AC joint injection.  He presents now for operative management after explanation of risks, benefits.     DESCRIPTION OF PROCEDURE:  The patient was brought to the operating room where general endotracheal anesthesia was induced.  Preoperative antibiotics administered.  Timeout was called.  The patient was placed in the beach chair position and with the head  in neutral position.  Right shoulder, arm and hand prescrubbed with alcohol and Betadine, allowed to air dry, prepped with DuraPrep solution and draped in sterile manner.  Preoperative range of motion was 60/100/170.  Good stability to the shoulder with  anterior and posterior testing.  After sterile prepping and draping the axilla was covered with Ioban and the field was sealed with Ioban.  A timeout was called and the posterior portal was created and anterior portal created under direct visualization.   Diagnostic arthroscopy was performed.  The rotator cuff was intact.  A little bit of fraying of the intra-articular subscap, which was debrided with a shaver.  No full thickness tear and nothing that really required any type of repair.  The patient did   have a significant type 2 degenerative SLAP tear.  Biceps tendon was released with the Arthrocare wand.  Superior labrum debrided.  Next, the instruments were removed from the portals, which were closed using 3-0 nylon.  Small about 3.5 cm incision made  off the anterolateral margin of the acromion. Deltoid split a measured distance of 4 cm from the anterolateral margin of the acromion marked with #1 Vicryl suture.  This was between the anterior middle raphaes of the deltoid.  Bursectomy performed.   Subacromial decompression performed with a rasp.  There was a partial release of the CA ligament.  Biceps tendon was then tenodesed into the bicipital groove using Arthrex knotless suture anchors proximally and distally within the groove.  Excellent  tension was achieved after placing a FiberLoop in the biceps tendon.  Oversewn x2 with a 0 Vicryl suture.  Subscap attachment intact by palpation as well as visualization.  At this time, thorough irrigation was performed.  Deltoid split was closed using  #1 Vicryl suture followed by interrupted inverted 0 Vicryl suture, 2-0 Vicryl suture, and 3-0 Monocryl with Steri-Strips and impervious dressings applied.  A shoulder immobilizer placed.  The patient tolerated the procedure well without immediate  complication.  [name]'s assistance was required for opening, closing, mobilization of tissue.  His assistance was medical necessity.     [email or web address] D: 04/19/2022 9:25:52 am T: 04/19/2022 9:53:00 am  JOB: [id number]/ [id number]

## 2022-04-19 NOTE — Brief Op Note (Signed)
   04/19/2022  9:22 AM  PATIENT:  Rebecca [name]  51 y.o. female  PRE-OPERATIVE DIAGNOSIS:  right shoulder bursitis, biceps tendonitis  POST-OPERATIVE DIAGNOSIS:  right shoulder bursitis, biceps tendonitis  PROCEDURE:  Procedure(s): RIGHT SHOULDER ARTHROSCOPY, SUBACROMIAL DECOMPRESSION,  BICEPS TENODESIS AND DEBRIDEMENT  SURGEON:  Surgeon(s): August Saucer, Corrie Mckusick, MD  ASSISTANT: Magnant PA  ANESTHESIA:   General  EBL: 5 ml    Total I/O In: 100 [IV Piggyback:100] Out: -   BLOOD ADMINISTERED: none  DRAINS: None  LOCAL MEDICATIONS USED:  none  SPECIMEN:  No Specimen  COUNTS:  YES  TOURNIQUET:  * No tourniquets in log *  DICTATION: .Other Dictation: Dictation Number [id number]  PLAN OF CARE: Discharge to home after PACU  PATIENT DISPOSITION:  PACU - hemodynamically stable

## 2022-04-19 NOTE — H&P (Signed)
Rebecca [name] is an 51 y.o. female.   Chief Complaint: Right shoulder pain HPI: Rebecca [name] is a 51 year old patient with right shoulder pain.  She did have a right shoulder AC joint injection 11/17/2021 which did not help.  And also had a glenohumeral joint injection which did not help.  Reports pain in the lateral deltoid region.  Been ongoing for a year.  She is having more bad days now than she was before.  Has some grinding with range of motion.  She states at times the shoulder feels like it will pop out of place.  She has done physical therapy which did not help.  Denies any neck pain or numbness and tingling and no radiation of pain below the elbow.  She works in a [employer].   MRI scan of the shoulder on the right-hand side from June 2023 is reviewed.  No full-thickness rotator cuff tear.  No definite superior labral tear but there is some thinning of the biceps tendon as it enters into the bicipital groove.  Some marrow edema changes in the [facility] joint as well which are mild.  Past Medical History:  Diagnosis Date   Anxiety    Difficult intubation 11/09/2011   uneventful intubation using elective McGrath MAC 3 video laryngoscope 10/29/19 given prior history   Hyperlipidemia    Insomnia     Past Surgical History:  Procedure Laterality Date   ABDOMINAL HYSTERECTOMY  11/09/2011   hysterectomy with right ooporectomy   BREAST EXCISIONAL BIOPSY Left    OOPHORECTOMY Left 10/29/2019   right ooporectomy 11/09/11; left oophorectoy 10/29/19 for ovarian torsion   TONSILLECTOMY Bilateral     Family History  Problem Relation Age of Onset   Depression Mother    Heart disease Mother    Diabetes Father    Hyperlipidemia Father    Cancer Maternal Grandfather    Diabetes Paternal Grandmother    Social History:  reports that she has never smoked. She has never used smokeless tobacco. She reports current alcohol use. She reports that she does not use drugs.  Allergies: No Known  Allergies  Medications Prior to Admission  Medication Sig Dispense Refill   atorvastatin (LIPITOR) 20 MG tablet TAKE 1 TABLET BY MOUTH EVERY DAY (Patient taking differently: Take 20 mg by mouth at bedtime.) 90 tablet 1   doxepin (SINEQUAN) 10 MG capsule Take 10 mg by mouth at bedtime.     estradiol (ESTRACE) 0.5 MG tablet TAKE 1 TABLET BY MOUTH EVERY DAY (Patient taking differently: Take 0.5 mg by mouth at bedtime.) 90 tablet 0   fluticasone (FLONASE) 50 MCG/ACT nasal spray SPRAY 2 SPRAYS INTO EACH NOSTRIL EVERY DAY 48 mL 2   naproxen sodium (ALEVE) 220 MG tablet Take 440 mg by mouth daily as needed (pain).     buPROPion (WELLBUTRIN XL) 150 MG 24 hr tablet TAKE 1 TABLET BY MOUTH EVERY DAY (Patient not taking: Reported on 03/22/2022) 90 tablet 1   doxepin (SINEQUAN) 25 MG capsule TAKE 1 CAPSULE BY MOUTH EVERYDAY AT BEDTIME (Patient not taking: Reported on 03/22/2022) 90 capsule 1   omeprazole (PRILOSEC) 20 MG capsule TAKE 1 CAPSULE BY MOUTH EVERY DAY (Patient not taking: Reported on 03/22/2022) 90 capsule 1   ondansetron (ZOFRAN) 4 MG tablet Take 1 tablet (4 mg total) by mouth every 8 (eight) hours as needed for nausea or vomiting. (Patient not taking: Reported on 03/22/2022) 20 tablet 0   WEGOVY 2.4 MG/0.75ML SOAJ INJECT 2.4 MG INTO THE SKIN ONCE  A WEEK. (Patient not taking: Reported on 03/22/2022) 3 mL 2    No results found for this or any previous visit (from the past 48 hour(s)). No results found.  Review of Systems  Musculoskeletal:  Positive for arthralgias.  All other systems reviewed and are negative.   Blood pressure 110/78, pulse 68, temperature 97.9 F (36.6 C), temperature source Oral, resp. rate 16, height _0  (1.651 m), weight 89.8 kg, last menstrual period 10/30/2018, SpO2 95 %. Physical Exam Vitals reviewed.  HENT:     Head: Normocephalic.     Nose: Nose normal.     Mouth/Throat:     Mouth: Mucous membranes are moist.  Eyes:     Pupils: Pupils are equal, round, and  reactive to light.  Cardiovascular:     Rate and Rhythm: Normal rate.     Pulses: Normal pulses.  Pulmonary:     Effort: Pulmonary effort is normal.  Abdominal:     General: Abdomen is flat.  Musculoskeletal:     Cervical back: Normal range of motion.  Skin:    Capillary Refill: Capillary refill takes less than 2 seconds.  Neurological:     General: No focal deficit present.     Mental Status: She is alert.  Psychiatric:        Mood and Affect: Mood normal.    Ortho exam demonstrates full active and passive range of motion of both shoulders. . She has symmetric motion passively of of 70/100/170 with good rotator cuff strength infraspinatus supraspinatus and subscap muscle testing. O'Brien's testing equivocal on the right negative on the left. No scapular dyskinesia with forward flexion. No paresthesias in the right arm C5-T1 distribution. Radial pulses intact bilaterally   Assessment/Plan  Impression is persistent right shoulder pain despite glenohumeral joint injection in [facility] joint injection.  Symptoms ongoing for a year.  Failure of conservative management.  Plan at this time is operative evaluation of the labrum and rotator cuff and biceps tendon.  Could consider biceps tenodesis if that MRI finding turns out to be valid in terms of partial-thickness tearing of the biceps tendon.  Could also perform bursectomy at that time.  Decision for or against [facility] joint resection concurrently can be made closer to the time of surgery which it appears will be in the winter.  The risk and benefits of surgery are discussed with the patient including not limited to infection nerve vessel damage incomplete pain relief as well as a period of approximately a month out of school particularly if biceps tenodesis is required.  Patient understands the risk and benefits and wishes to proceed.  All questions answered.  Would use a shoulder brace CPM for postop range of motion.    Anderson Malta, MD 04/19/2022, 7:05  AM

## 2022-04-19 NOTE — Anesthesia Procedure Notes (Addendum)
Anesthesia Regional Block: Interscalene brachial plexus block   Pre-Anesthetic Checklist: , timeout performed,  Correct Patient, Correct Site, Correct Laterality,  Correct Procedure, Correct Position, site marked,  Risks and benefits discussed,  Pre-op evaluation,  At surgeon's request and post-op pain management  Laterality: Right  Prep: Maximum Sterile Barrier Precautions used, chloraprep       Needles:  Injection technique: Single-shot  Needle Type: Echogenic Stimulator Needle     Needle Length: 5cm  Needle Gauge: 21     Additional Needles:   Procedures:,,,, ultrasound used (permanent image in chart),,    Narrative:  Start time: 04/19/2022 7:00 AM End time: 04/19/2022 7:06 AM Injection made incrementally with aspirations every 5 mL. Anesthesiologist: Elmer Picker, MD

## 2022-04-19 NOTE — Transfer of Care (Signed)
Immediate Anesthesia Transfer of Care Note  Patient: Rebecca [name]  Procedure(s) Performed: RIGHT SHOULDER ARTHROSCOPY, SUBACROMIAL DECOMPRESSION,  BICEPS TENODESIS AND DEBRIDEMENT (Right)  Patient Location: PACU  Anesthesia Type:General  Level of Consciousness: drowsy and patient cooperative  Airway & Oxygen Therapy: Patient Spontanous Breathing  Post-op Assessment: Report given to RN and Post -op Vital signs reviewed and stable  Post vital signs: stable  Last Vitals:  Vitals Value Taken Time  BP 121/72 04/19/22 0923  Temp    Pulse 71 04/19/22 0924  Resp 14 04/19/22 0924  SpO2 96 % 04/19/22 0924  Vitals shown include unvalidated device data.  Last Pain:  Vitals:   04/19/22 0616  TempSrc:   PainSc: 0-No pain         Complications: No notable events documented.

## 2022-04-20 ENCOUNTER — Encounter (HOSPITAL_COMMUNITY): Payer: Self-pay | Admitting: Orthopedic Surgery

## 2022-04-20 NOTE — Anesthesia Postprocedure Evaluation (Signed)
Anesthesia Post Note  Patient: Rebecca [name]  Procedure(s) Performed: RIGHT SHOULDER ARTHROSCOPY, SUBACROMIAL DECOMPRESSION,  BICEPS TENODESIS AND DEBRIDEMENT (Right)     Patient location during evaluation: PACU Anesthesia Type: Regional and General Level of consciousness: awake and alert Pain management: pain level controlled Vital Signs Assessment: post-procedure vital signs reviewed and stable Respiratory status: spontaneous breathing, nonlabored ventilation, respiratory function stable and patient connected to nasal cannula oxygen Cardiovascular status: blood pressure returned to baseline and stable Postop Assessment: no apparent nausea or vomiting Anesthetic complications: no  No notable events documented.  Last Vitals:  Vitals:   04/19/22 0945 04/19/22 1000  BP: 113/82 114/80  Pulse: 70 (!) 54  Resp: 15 13  Temp:  (!) 36.2 C  SpO2: 94% 95%    Last Pain:  Vitals:   04/19/22 1000  TempSrc:   PainSc: 0-No pain                 Kole Hilyard L Melaine Mcphee

## 2022-04-21 ENCOUNTER — Other Ambulatory Visit: Payer: Self-pay | Admitting: Surgical

## 2022-04-21 MED ORDER — GABAPENTIN 300 MG PO CAPS: 300.0000 mg | ORAL_CAPSULE | Freq: Three times a day (TID) | ORAL | 0 refills | Status: DC

## 2022-04-21 MED ORDER — CELECOXIB 100 MG PO CAPS: 100.0000 mg | ORAL_CAPSULE | Freq: Two times a day (BID) | ORAL | 0 refills | Status: DC

## 2022-04-27 ENCOUNTER — Other Ambulatory Visit: Payer: Self-pay | Admitting: Family Medicine

## 2022-04-27 ENCOUNTER — Other Ambulatory Visit: Payer: Self-pay

## 2022-04-27 DIAGNOSIS — Z1231 Encounter for screening mammogram for malignant neoplasm of breast: Secondary | ICD-10-CM

## 2022-04-28 ENCOUNTER — Other Ambulatory Visit: Payer: Self-pay | Admitting: Family Medicine

## 2022-04-28 DIAGNOSIS — E782 Mixed hyperlipidemia: Secondary | ICD-10-CM

## 2022-04-28 DIAGNOSIS — S43431A Superior glenoid labrum lesion of right shoulder, initial encounter: Secondary | ICD-10-CM

## 2022-04-28 DIAGNOSIS — M7521 Bicipital tendinitis, right shoulder: Secondary | ICD-10-CM

## 2022-04-29 ENCOUNTER — Other Ambulatory Visit: Payer: Self-pay | Admitting: Surgical

## 2022-04-29 ENCOUNTER — Ambulatory Visit (INDEPENDENT_AMBULATORY_CARE_PROVIDER_SITE_OTHER): Payer: BC Managed Care – PPO | Admitting: Surgical

## 2022-04-29 ENCOUNTER — Encounter: Payer: Self-pay | Admitting: Surgical

## 2022-04-29 ENCOUNTER — Telehealth: Payer: Self-pay

## 2022-04-29 DIAGNOSIS — S46101A Unspecified injury of muscle, fascia and tendon of long head of biceps, right arm, initial encounter: Secondary | ICD-10-CM

## 2022-04-29 MED ORDER — OXYCODONE-ACETAMINOPHEN 5-325 MG PO TABS: 1.0000 | ORAL_TABLET | Freq: Three times a day (TID) | ORAL | 0 refills | Status: DC | PRN

## 2022-04-29 MED ORDER — METHOCARBAMOL 500 MG PO TABS: 500.0000 mg | ORAL_TABLET | Freq: Three times a day (TID) | ORAL | 1 refills | Status: DC | PRN

## 2022-04-29 MED ORDER — GABAPENTIN 300 MG PO CAPS: 300.0000 mg | ORAL_CAPSULE | Freq: Three times a day (TID) | ORAL | 0 refills | Status: DC

## 2022-04-29 NOTE — Progress Notes (Cosign Needed)
Post-Op Visit Note   Patient: Rebecca [name]           Date of Birth: [birth year]           MRN: [id number] Visit Date: 04/29/2022 PCP: Baruch Gouty, FNP   Assessment & Plan:  Chief Complaint:  Chief Complaint  Patient presents with   Right Shoulder - Routine Post Op    04/19/22 (10d) Rebecca [name],Right Shoulder Arthroscopy, Subacromial Decompression,  Biceps Tenodesis And Debridement      Visit Diagnoses:  1. Injury of tendon of long head of right biceps, initial encounter     Plan: Rebecca [name] is a 51 y.o. female who presents s/p right biceps tenodesis on 04/19/2022.  Patient is doing well and pain is overall controlled.  Up to 90 degrees on CPM machine.  Denies any chest pain, SOB, fevers, chills. Taking pain medication every 8-12 hours.    On exam, patient has range of motion 30/90/120.  Intact EPL, FPL, finger abduction, finger adduction, pronation/supination, bicep, tricep, deltoid of operative extremity.  Axillary nerve intact with deltoid firing.  Incisions are healing well without evidence of infection or dehiscence.  Sutures removed and replaced with Steri-Strips today.  2+ radial pulse of the operative extremity  Plan is okay to discontinue sling, just use the sling when she is outside from her house at large gatherings.  No lifting with the operative extremity and this was strongly encouraged to her as far as avoiding lifting.  She is okay for active range of motion of the arm but avoid shoulder extension.  She will start physical therapy in 2 weeks at [facility] physical therapy where she has been before in order to focus on active and passive range of motion of the shoulder.  No lifting until 6 weeks out from surgery.  She will continue with the CPM chair up until she starts physical therapy and then return it.  Follow-up in 4 weeks for clinical recheck with Dr. Marlou [name].  Oxycodone refilled today and will wean off this around 4 to 6 weeks after surgery.  Call with any  concerns in the meantime..   Follow-Up Instructions: No follow-ups on file.   Orders:  No orders of the defined types were placed in this encounter.  No orders of the defined types were placed in this encounter.   Imaging: No results found.  PMFS History: Patient Active Problem List   Diagnosis Date Noted   Biceps tendinitis of right upper extremity 04/28/2022   Degenerative superior labral anterior-to-posterior (SLAP) tear of right shoulder 04/28/2022   Chronic right shoulder pain 09/10/2021   Depression, major, single episode, mild ([place]) 04/28/2021   GAD (generalized anxiety disorder) 04/28/2021   Insomnia 01/14/2021   Mixed hyperlipidemia 01/14/2021   BMI 35.0-35.9,adult 01/14/2021   History of total hysterectomy 01/14/2021   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   Past Medical History:  Diagnosis Date   Anxiety    Difficult intubation 11/09/2011   uneventful intubation using elective McGrath MAC 3 video laryngoscope 10/29/19 given prior history   Hyperlipidemia    Insomnia     Family History  Problem Relation Age of Onset   Depression Mother    Heart disease Mother    Diabetes Father    Hyperlipidemia Father    Cancer Maternal Grandfather    Diabetes Paternal Grandmother     Past Surgical History:  Procedure Laterality Date   ABDOMINAL HYSTERECTOMY  11/09/2011   hysterectomy with right ooporectomy  BREAST EXCISIONAL BIOPSY Left    OOPHORECTOMY Left 10/29/2019   right ooporectomy 11/09/11; left oophorectoy 10/29/19 for ovarian torsion   SHOULDER ARTHROSCOPY WITH SUBACROMIAL DECOMPRESSION AND BICEP TENDON REPAIR Right 04/19/2022   Procedure: RIGHT SHOULDER ARTHROSCOPY, SUBACROMIAL DECOMPRESSION,  BICEPS TENODESIS AND DEBRIDEMENT;  Surgeon: Meredith Pel, MD;  Location: [place];  Service: Orthopedics;  Laterality: Right;   TONSILLECTOMY Bilateral    Social History   Occupational History   Occupation: [employer]  Tobacco Use    Smoking status: Never   Smokeless tobacco: Never  Vaping Use   Vaping Use: Never used  Substance and Sexual Activity   Alcohol use: Yes    Comment: 1-2 drinks per week   Drug use: Never   Sexual activity: Yes    Comment: total hysterectomy

## 2022-04-29 NOTE — Telephone Encounter (Signed)
PA started on [email or web address] for oxycodone.

## 2022-05-04 ENCOUNTER — Other Ambulatory Visit: Payer: Self-pay | Admitting: Surgical

## 2022-05-06 ENCOUNTER — Other Ambulatory Visit: Payer: Self-pay

## 2022-05-09 ENCOUNTER — Ambulatory Visit
Admission: RE | Admit: 2022-05-09 | Discharge: 2022-05-09 | Disposition: A | Discharge status: Home / Self Care | Payer: BC Managed Care – PPO | Source: Ambulatory Visit | Attending: Family Medicine | Admitting: Family Medicine

## 2022-05-09 DIAGNOSIS — Z1231 Encounter for screening mammogram for malignant neoplasm of breast: Secondary | ICD-10-CM

## 2022-05-16 ENCOUNTER — Other Ambulatory Visit: Payer: Self-pay | Admitting: Family Medicine

## 2022-05-16 DIAGNOSIS — Z90710 Acquired absence of both cervix and uterus: Secondary | ICD-10-CM

## 2022-05-18 ENCOUNTER — Other Ambulatory Visit: Payer: Self-pay | Admitting: Surgical

## 2022-05-19 ENCOUNTER — Encounter: Payer: Self-pay | Admitting: Family Medicine

## 2022-05-20 ENCOUNTER — Other Ambulatory Visit: Payer: Self-pay | Admitting: Family Medicine

## 2022-05-20 DIAGNOSIS — E782 Mixed hyperlipidemia: Secondary | ICD-10-CM

## 2022-05-25 ENCOUNTER — Other Ambulatory Visit: Payer: Self-pay | Admitting: Family Medicine

## 2022-05-25 DIAGNOSIS — F320 Major depressive disorder, single episode, mild: Secondary | ICD-10-CM

## 2022-05-25 DIAGNOSIS — F411 Generalized anxiety disorder: Secondary | ICD-10-CM

## 2022-05-25 DIAGNOSIS — F5101 Primary insomnia: Secondary | ICD-10-CM

## 2022-05-30 ENCOUNTER — Ambulatory Visit (INDEPENDENT_AMBULATORY_CARE_PROVIDER_SITE_OTHER): Payer: BC Managed Care – PPO | Admitting: Orthopedic Surgery

## 2022-05-30 DIAGNOSIS — M19011 Primary osteoarthritis, right shoulder: Secondary | ICD-10-CM

## 2022-05-31 ENCOUNTER — Encounter: Payer: Self-pay | Admitting: Orthopedic Surgery

## 2022-05-31 NOTE — Progress Notes (Unsigned)
Office Visit Note   Patient: Rebecca [name]           Date of Birth: 10-May-1970           MRN: [id number] Visit Date: 05/30/2022 Requested by: Baruch Gouty, [place],   [id number] PCP: Baruch Gouty, FNP  Subjective: Chief Complaint  Patient presents with   Right Shoulder - Follow-up    HPI: Rebecca [name] is a 52 y.o. female who presents to the office reporting ***.                ROS: All systems reviewed are negative as they relate to the chief complaint within the history of present illness.  Patient denies fevers or chills.  Assessment & Plan: Visit Diagnoses: No diagnosis found.  Plan: ***  Follow-Up Instructions: No follow-ups on file.   Orders:  No orders of the defined types were placed in this encounter.  No orders of the defined types were placed in this encounter.     Procedures: No procedures performed   Clinical Data: No additional findings.  Objective: Vital Signs: LMP 10/30/2018 (Approximate) Comment: total hysterectmy  Physical Exam:  Constitutional: Patient appears well-developed HEENT:  Head: Normocephalic Eyes:EOM are normal Neck: Normal range of motion Cardiovascular: Normal rate Pulmonary/chest: Effort normal Neurologic: Patient is alert Skin: Skin is warm Psychiatric: Patient has normal mood and affect  Ortho Exam: ***  Specialty Comments:  No specialty comments available.  Imaging: No results found.   PMFS History: Patient Active Problem List   Diagnosis Date Noted   Biceps tendinitis of right upper extremity 04/28/2022   Degenerative superior labral anterior-to-posterior (SLAP) tear of right shoulder 04/28/2022   Chronic right shoulder pain 09/10/2021   Depression, major, single episode, mild ([place]) 04/28/2021   GAD (generalized anxiety disorder) 04/28/2021   Insomnia 01/14/2021   Mixed hyperlipidemia 01/14/2021   BMI 35.0-35.9,adult 01/14/2021   History of total hysterectomy  01/14/2021   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   Past Medical History:  Diagnosis Date   Anxiety    Difficult intubation 11/09/2011   uneventful intubation using elective McGrath MAC 3 video laryngoscope 10/29/19 given prior history   Hyperlipidemia    Insomnia     Family History  Problem Relation Age of Onset   Depression Mother    Heart disease Mother    Diabetes Father    Hyperlipidemia Father    Cancer Maternal Grandfather    Diabetes Paternal Grandmother    Breast cancer Neg Hx     Past Surgical History:  Procedure Laterality Date   ABDOMINAL HYSTERECTOMY  11/09/2011   hysterectomy with right ooporectomy   BREAST EXCISIONAL BIOPSY Left    OOPHORECTOMY Left 10/29/2019   right ooporectomy 11/09/11; left oophorectoy 10/29/19 for ovarian torsion   SHOULDER ARTHROSCOPY WITH SUBACROMIAL DECOMPRESSION AND BICEP TENDON REPAIR Right 04/19/2022   Procedure: RIGHT SHOULDER ARTHROSCOPY, SUBACROMIAL DECOMPRESSION,  BICEPS TENODESIS AND DEBRIDEMENT;  Surgeon: Meredith Pel, MD;  Location: [place];  Service: Orthopedics;  Laterality: Right;   TONSILLECTOMY Bilateral    Social History   Occupational History   Occupation: [employer]  Tobacco Use   Smoking status: Never   Smokeless tobacco: Never  Vaping Use   Vaping Use: Never used  Substance and Sexual Activity   Alcohol use: Yes    Comment: 1-2 drinks per week   Drug use: Never   Sexual activity: Yes    Comment: total hysterectomy

## 2022-06-01 ENCOUNTER — Telehealth (INDEPENDENT_AMBULATORY_CARE_PROVIDER_SITE_OTHER): Payer: BC Managed Care – PPO | Admitting: Family Medicine

## 2022-06-01 ENCOUNTER — Encounter: Payer: Self-pay | Admitting: Family Medicine

## 2022-06-01 ENCOUNTER — Other Ambulatory Visit: Payer: BC Managed Care – PPO

## 2022-06-01 DIAGNOSIS — Z90710 Acquired absence of both cervix and uterus: Secondary | ICD-10-CM

## 2022-06-01 DIAGNOSIS — R030 Elevated blood-pressure reading, without diagnosis of hypertension: Secondary | ICD-10-CM | POA: Diagnosis not present

## 2022-06-01 DIAGNOSIS — E782 Mixed hyperlipidemia: Secondary | ICD-10-CM | POA: Diagnosis not present

## 2022-06-01 DIAGNOSIS — Z7689 Persons encountering health services in other specified circumstances: Secondary | ICD-10-CM | POA: Diagnosis not present

## 2022-06-01 DIAGNOSIS — R911 Solitary pulmonary nodule: Secondary | ICD-10-CM

## 2022-06-01 MED ORDER — ZEPBOUND 2.5 MG/0.5ML ~~LOC~~ SOAJ: 2.5000 mg | SUBCUTANEOUS | 0 refills | Status: DC

## 2022-06-01 NOTE — Progress Notes (Signed)
Virtual Visit via MyChart Video Note Due to COVID-19 pandemic this visit was conducted virtually. This visit type was conducted due to national recommendations for restrictions regarding the COVID-19 Pandemic (e.g. social distancing, sheltering in place) in an effort to limit this patient's exposure and mitigate transmission in our community. All issues noted in this document were discussed and addressed.  A physical exam was not performed with this format.   I connected with [name] on 06/01/2022 at 0900 by MyChart Video and verified that I am speaking with the correct person using two identifiers. [name] is currently located at home and family is currently with them during visit. The provider, Monia Pouch, FNP is located in their office at time of visit.  I discussed the limitations, risks, security and privacy concerns of performing an evaluation and management service by virtual visit and the availability of in person appointments. I also discussed with the patient that there may be a patient responsible charge related to this service. The patient expressed understanding and agreed to proceed.  Subjective:  Patient ID: Rebecca [name], female    DOB: 1970-05-08, 52 y.o.   MRN: [id number]  Chief Complaint:  No chief complaint on file.   HPI: Rebecca [name] is a 52 y.o. female presenting on 06/01/2022 for No chief complaint on file.   Pt presents today for weight management. She was previously on [facility] and did very well. She switched to [facility] due to insurance coverage and was unable to tolerate due to the significant side effects. She has been doing weight watchers and Nutrisystem without significant reduction in weight. She does follow a regular exercise routine. Lost 8 lbs initially but is now unable to drop more weight. She reports this all started after having her hysterectomy. Feels this may play a role in her weight changes. She denies excessive  caloric intake. She also reports some facial hair growth over the last several months. Seems to be worsening.  Pt had a CT scan in the past which revealed a lung nodule, she has not had her follow up imaging for reevaluation. No cough, chest pain, hemoptysis, weight loss, night sweats, weakness, or confusion reported. She is taking her cholesterol medications without associated side effects.      Relevant past medical, surgical, family, and social history reviewed and updated as indicated.  Allergies and medications reviewed and updated.   Past Medical History:  Diagnosis Date   Anxiety    Difficult intubation 11/09/2011   uneventful intubation using elective McGrath MAC 3 video laryngoscope 10/29/19 given prior history   Hyperlipidemia    Insomnia     Past Surgical History:  Procedure Laterality Date   ABDOMINAL HYSTERECTOMY  11/09/2011   hysterectomy with right ooporectomy   BREAST EXCISIONAL BIOPSY Left    OOPHORECTOMY Left 10/29/2019   right ooporectomy 11/09/11; left oophorectoy 10/29/19 for ovarian torsion   SHOULDER ARTHROSCOPY WITH SUBACROMIAL DECOMPRESSION AND BICEP TENDON REPAIR Right 04/19/2022   Procedure: RIGHT SHOULDER ARTHROSCOPY, SUBACROMIAL DECOMPRESSION,  BICEPS TENODESIS AND DEBRIDEMENT;  Surgeon: Meredith Pel, MD;  Location: [facility];  Service: Orthopedics;  Laterality: Right;   TONSILLECTOMY Bilateral     Social History   Socioeconomic History   Marital status: Married    Spouse name: Not on file   Number of children: Not on file   Years of education: Not on file   Highest education level: Not on file  Occupational History   Occupation: [employer]  Tobacco Use  Smoking status: Never   Smokeless tobacco: Never  Vaping Use   Vaping Use: Never used  Substance and Sexual Activity   Alcohol use: Yes    Comment: 1-2 drinks per week   Drug use: Never   Sexual activity: Yes    Comment: total hysterectomy  Other Topics Concern   Not on  file  Social History Narrative   Not on file   Social Determinants of Health   Financial Resource Strain: Not on file  Food Insecurity: Not on file  Transportation Needs: Not on file  Physical Activity: Not on file  Stress: Not on file  Social Connections: Not on file  Intimate Partner Violence: Not on file    Outpatient Encounter Medications as of 06/01/2022  Medication Sig   tirzepatide (ZEPBOUND) 2.5 MG/0.5ML Pen Inject 2.5 mg into the skin once a week for 4 doses.   atorvastatin (LIPITOR) 20 MG tablet Take 1 tablet (20 mg total) by mouth daily.   buPROPion (WELLBUTRIN XL) 150 MG 24 hr tablet TAKE 1 TABLET BY MOUTH EVERY DAY (Patient not taking: Reported on 03/22/2022)   celecoxib (CELEBREX) 100 MG capsule TAKE 1 CAPSULE BY MOUTH TWICE A DAY   doxepin (SINEQUAN) 10 MG capsule Take 10 mg by mouth at bedtime.   doxepin (SINEQUAN) 25 MG capsule TAKE 1 CAPSULE BY MOUTH EVERYDAY AT BEDTIME   estradiol (ESTRACE) 0.5 MG tablet Take 1 tablet (0.5 mg total) by mouth daily. (NEEDS TO BE SEEN BEFORE NEXT REFILL)   fluticasone (FLONASE) 50 MCG/ACT nasal spray SPRAY 2 SPRAYS INTO EACH NOSTRIL EVERY DAY   gabapentin (NEURONTIN) 300 MG capsule Take 1 capsule (300 mg total) by mouth 3 (three) times daily.   methocarbamol (ROBAXIN) 500 MG tablet Take 1 tablet (500 mg total) by mouth every 8 (eight) hours as needed for muscle spasms.   naproxen sodium (ALEVE) 220 MG tablet Take 440 mg by mouth daily as needed (pain).   omeprazole (PRILOSEC) 20 MG capsule TAKE 1 CAPSULE BY MOUTH EVERY DAY (Patient not taking: Reported on 03/22/2022)   ondansetron (ZOFRAN) 4 MG tablet Take 1 tablet (4 mg total) by mouth every 8 (eight) hours as needed for nausea or vomiting. (Patient not taking: Reported on 03/22/2022)   oxyCODONE-acetaminophen (PERCOCET) 5-325 MG tablet Take 1 tablet by mouth every 8 (eight) hours as needed for severe pain.   [DISCONTINUED] WEGOVY 2.4 MG/0.75ML SOAJ INJECT 2.4 MG INTO THE SKIN ONCE A  WEEK. (Patient not taking: Reported on 03/22/2022)   No facility-administered encounter medications on file as of 06/01/2022.    No Known Allergies  Review of Systems  Constitutional:  Positive for unexpected weight change. Negative for activity change, appetite change, chills, diaphoresis, fatigue and fever.  HENT: Negative.    Eyes: Negative.  Negative for photophobia and visual disturbance.  Respiratory:  Negative for apnea, cough, choking, chest tightness, shortness of breath, wheezing and stridor.   Cardiovascular:  Negative for chest pain, palpitations and leg swelling.  Gastrointestinal:  Negative for abdominal pain, blood in stool, constipation, diarrhea, nausea and vomiting.  Endocrine: Negative.  Negative for polydipsia, polyphagia and polyuria.  Genitourinary:  Negative for decreased urine volume, difficulty urinating, dysuria, frequency and urgency.  Musculoskeletal:  Negative for arthralgias and myalgias.  Skin:        Increased dark facial hair growth  Allergic/Immunologic: Negative.   Neurological:  Negative for dizziness, tremors, seizures, syncope, facial asymmetry, speech difficulty, weakness, light-headedness, numbness and headaches.  Hematological: Negative.   Psychiatric/Behavioral:  Negative for confusion, hallucinations, sleep disturbance and suicidal ideas.   All other systems reviewed and are negative.      Observations/Objective: No vital signs or physical exam, this was a virtual health encounter.  Pt alert and oriented, answers all questions appropriately, and able to speak in full sentences.    Assessment and Plan: Diagnoses and all orders for this visit:  Morbid obesity ([place]) Encounter for weight management BMI greater than 35 with underlying comorbid conditions of hyperlipidemia and hypertension. Has tried and failed [place] and Saxenda. Will restart tirzepatide as pt did very well on this in the past. Labs pending, diet and exercise encouraged.  -      CMP14+EGFR -     CBC with Differential/Platelet -     Hormone Panel -     tirzepatide (ZEPBOUND) 2.5 MG/0.5ML Pen; Inject 2.5 mg into the skin once a week for 4 doses.  Elevated blood pressure reading without diagnosis of hypertension DASH diet and exercise encouraged.  -     CMP14+EGFR -     CBC with Differential/Platelet -     Hormone Panel -     tirzepatide (ZEPBOUND) 2.5 MG/0.5ML Pen; Inject 2.5 mg into the skin once a week for 4 doses.  Mixed hyperlipidemia Diet encouraged - increase intake of fresh fruits and vegetables, increase intake of lean proteins. Bake, broil, or grill foods. Avoid fried, greasy, and fatty foods. Avoid fast foods. Increase intake of fiber-rich whole grains. Exercise encouraged - at least 150 minutes per week and advance as tolerated.  Goal BMI < 25. Continue medications as prescribed. Follow up in 3-6 months as discussed.  -     CMP14+EGFR -     CBC with Differential/Platelet -     Hormone Panel -     tirzepatide (ZEPBOUND) 2.5 MG/0.5ML Pen; Inject 2.5 mg into the skin once a week for 4 doses.  History of total hysterectomy Inability to regulate weight and female pattern hair growth noted on face. Will check hormone panel and treat if warranted.  -     CMP14+EGFR -     CBC with Differential/Platelet -     Hormone Panel  Lung nodule Noted on prior CT, follow up imaging has not been completed, will order today. Pt asymptomatic.  -     CT CHEST NODULE FOLLOW UP LOW DOSE W/O; Future     Follow Up Instructions: Return in about 8 weeks (around 07/27/2022) for BMI.    I discussed the assessment and treatment plan with the patient. The patient was provided an opportunity to ask questions and all were answered. The patient agreed with the plan and demonstrated an understanding of the instructions.   The patient was advised to call back or seek an in-person evaluation if the symptoms worsen or if the condition fails to improve as anticipated.  The above  assessment and management plan was discussed with the patient. The patient verbalized understanding of and has agreed to the management plan. Patient is aware to call the clinic if they develop any new symptoms or if symptoms persist or worsen. Patient is aware when to return to the clinic for a follow-up visit. Patient educated on when it is appropriate to go to the emergency department.    I provided 20 minutes of time during this MyChart Video encounter.   Monia Pouch, FNP-C [facility] [address] [place], [place] [address] [phone] 06/01/2022

## 2022-06-02 NOTE — Progress Notes (Signed)
   Post-Op Visit Note   Patient: Rebecca [name]           Date of Birth: 10-Feb-1971           MRN: [id number] Visit Date: 05/30/2022 PCP: Baruch Gouty, FNP   Assessment & Plan:  Chief Complaint:  Chief Complaint  Patient presents with   Right Shoulder - Follow-up   Visit Diagnoses: No diagnosis found.  Plan: [name] is a patient is now about 6 weeks out right shoulder arthroscopy biceps tenodesis and subacromial decompression.  Doing physical [facility] at [facility].  Working on movement and stretching.  I do not think she is getting ready for work until mid March.  Return to clinic 1 to 2 days before that.  Physical therapy indicated at [facility] to continue.  Range of motion of that right shoulder 60/90/120.  She works in the [employer] and show she is really going to need to have excellent function and strength in that shoulder before returning.  She is having some volar hand pain which may be related to the block.  No intervention required for that at this time.  Follow-Up Instructions: No follow-ups on file.   Orders:  No orders of the defined types were placed in this encounter.  No orders of the defined types were placed in this encounter.   Imaging: No results found.  PMFS History: Patient Active Problem List   Diagnosis Date Noted   Biceps tendinitis of right upper extremity 04/28/2022   Degenerative superior labral anterior-to-posterior (SLAP) tear of right shoulder 04/28/2022   Chronic right shoulder pain 09/10/2021   Depression, major, single episode, mild ([place]) 04/28/2021   GAD (generalized anxiety disorder) 04/28/2021   Insomnia 01/14/2021   Mixed hyperlipidemia 01/14/2021   BMI 35.0-35.9,adult 01/14/2021   History of total hysterectomy 01/14/2021   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   Past Medical History:  Diagnosis Date   Anxiety    Difficult intubation 11/09/2011   uneventful intubation using elective McGrath MAC 3 video  laryngoscope 10/29/19 given prior history   Hyperlipidemia    Insomnia     Family History  Problem Relation Age of Onset   Depression Mother    Heart disease Mother    Diabetes Father    Hyperlipidemia Father    Cancer Maternal Grandfather    Diabetes Paternal Grandmother    Breast cancer Neg Hx     Past Surgical History:  Procedure Laterality Date   ABDOMINAL HYSTERECTOMY  11/09/2011   hysterectomy with right ooporectomy   BREAST EXCISIONAL BIOPSY Left    OOPHORECTOMY Left 10/29/2019   right ooporectomy 11/09/11; left oophorectoy 10/29/19 for ovarian torsion   SHOULDER ARTHROSCOPY WITH SUBACROMIAL DECOMPRESSION AND BICEP TENDON REPAIR Right 04/19/2022   Procedure: RIGHT SHOULDER ARTHROSCOPY, SUBACROMIAL DECOMPRESSION,  BICEPS TENODESIS AND DEBRIDEMENT;  Surgeon: Meredith Pel, MD;  Location: [facility];  Service: Orthopedics;  Laterality: Right;   TONSILLECTOMY Bilateral    Social History   Occupational History   Occupation: [employer]  Tobacco Use   Smoking status: Never   Smokeless tobacco: Never  Vaping Use   Vaping Use: Never used  Substance and Sexual Activity   Alcohol use: Yes    Comment: 1-2 drinks per week   Drug use: Never   Sexual activity: Yes    Comment: total hysterectomy

## 2022-06-03 ENCOUNTER — Ambulatory Visit: Payer: BC Managed Care – PPO | Admitting: Family Medicine

## 2022-06-05 ENCOUNTER — Encounter: Payer: Self-pay | Admitting: Family Medicine

## 2022-06-06 ENCOUNTER — Encounter: Payer: Self-pay | Admitting: Family Medicine

## 2022-06-08 ENCOUNTER — Other Ambulatory Visit: Payer: Self-pay

## 2022-06-08 ENCOUNTER — Encounter: Payer: Self-pay | Admitting: Family Medicine

## 2022-06-08 ENCOUNTER — Other Ambulatory Visit: Payer: Self-pay | Admitting: Family Medicine

## 2022-06-08 ENCOUNTER — Telehealth: Payer: Self-pay | Admitting: Family Medicine

## 2022-06-08 DIAGNOSIS — Z90710 Acquired absence of both cervix and uterus: Secondary | ICD-10-CM

## 2022-06-08 MED ORDER — ESTRADIOL 0.5 MG PO TABS: 0.5000 mg | ORAL_TABLET | Freq: Every day | ORAL | 0 refills | Status: DC

## 2022-06-08 NOTE — Telephone Encounter (Signed)
Left detailed message.   

## 2022-06-08 NOTE — Telephone Encounter (Signed)
Called pt to make her an appt so she can get more refills. Pt upset. Says she just had a visit with PCP a week ago and her medicines were discussed so she doesn't understand why she has to make another appt to get meds refilled.  Please advise.

## 2022-06-08 NOTE — Telephone Encounter (Signed)
Rakes NTBS 30 days given 05/16/22

## 2022-06-09 ENCOUNTER — Ambulatory Visit: Payer: BC Managed Care – PPO | Admitting: Family Medicine

## 2022-06-10 ENCOUNTER — Ambulatory Visit: Payer: BC Managed Care – PPO | Admitting: Family Medicine

## 2022-06-17 ENCOUNTER — Other Ambulatory Visit: Payer: Self-pay | Admitting: Family Medicine

## 2022-06-17 DIAGNOSIS — F5101 Primary insomnia: Secondary | ICD-10-CM

## 2022-06-17 DIAGNOSIS — F411 Generalized anxiety disorder: Secondary | ICD-10-CM

## 2022-06-17 DIAGNOSIS — F320 Major depressive disorder, single episode, mild: Secondary | ICD-10-CM

## 2022-06-20 ENCOUNTER — Telehealth: Payer: Self-pay | Admitting: Family Medicine

## 2022-06-20 NOTE — Telephone Encounter (Signed)
Please review and advise.

## 2022-06-21 NOTE — Telephone Encounter (Signed)
Lmtcb.

## 2022-06-21 NOTE — Telephone Encounter (Signed)
Patient return call. ?

## 2022-06-22 ENCOUNTER — Ambulatory Visit: Payer: BC Managed Care – PPO | Admitting: Family Medicine

## 2022-06-22 ENCOUNTER — Encounter: Payer: Self-pay | Admitting: Family Medicine

## 2022-06-22 VITALS — BP 128/71 | HR 74 | Temp 97.7°F | Ht 65.0 in | Wt 205.2 lb

## 2022-06-22 DIAGNOSIS — R110 Nausea: Secondary | ICD-10-CM

## 2022-06-22 DIAGNOSIS — G44209 Tension-type headache, unspecified, not intractable: Secondary | ICD-10-CM

## 2022-06-22 DIAGNOSIS — M545 Low back pain, unspecified: Secondary | ICD-10-CM | POA: Diagnosis not present

## 2022-06-22 MED ORDER — ONDANSETRON HCL 4 MG PO TABS: 4.0000 mg | ORAL_TABLET | Freq: Three times a day (TID) | ORAL | 0 refills | Status: DC | PRN

## 2022-06-22 MED ORDER — MELOXICAM 15 MG PO TABS: 15.0000 mg | ORAL_TABLET | Freq: Every day | ORAL | 0 refills | Status: DC

## 2022-06-22 MED ORDER — PREDNISONE 20 MG PO TABS: 40.0000 mg | ORAL_TABLET | Freq: Every day | ORAL | 0 refills | Status: AC

## 2022-06-22 MED ORDER — METHYLPREDNISOLONE ACETATE 80 MG/ML IJ SUSP
80.0000 mg | Freq: Once | INTRAMUSCULAR | Status: AC
  Administered 2022-06-22: 80 mg via INTRAMUSCULAR

## 2022-06-22 NOTE — Progress Notes (Signed)
Acute Office Visit  Subjective:     Patient ID: Rebecca [name], female    DOB: 1970/08/21, 52 y.o.   MRN: [id number]  Chief Complaint  Patient presents with   Nausea   Headache    `    Back Pain This is a new problem. Episode onset: 5 days ago. The problem occurs constantly. The problem has been gradually worsening since onset. The pain is present in the gluteal and lumbar spine. The quality of the pain is described as aching (sharp). The pain does not radiate. The pain is at a severity of 9/10. The symptoms are aggravated by sitting, standing, bending and twisting. Associated symptoms include headaches (started yesterday, frontal bilateral). Pertinent negatives include no abdominal pain, bladder incontinence, bowel incontinence, chest pain, dysuria, fever, leg pain, numbness, paresis, paresthesias, pelvic pain, perianal numbness, tingling, weakness or weight loss. (Nausea started this morning with one episode of NBNB emesis) She has tried muscle relaxant and analgesics (massage) for the symptoms. The treatment provided no relief.   No injury.   She was seen at [facility] in [facility] 2 days ago. She has a negative lumbar xray, negative UA. She was given a toradol injection and vicodin. She reprots that she has not been staying well hydration.     Review of Systems  Constitutional:  Negative for chills, fever and weight loss.  HENT:  Negative for congestion.   Eyes:  Negative for blurred vision, double vision, photophobia, pain and discharge.  Respiratory:  Negative for cough, hemoptysis, sputum production, shortness of breath and wheezing.   Cardiovascular:  Negative for chest pain, palpitations, orthopnea, claudication and leg swelling.  Gastrointestinal:  Positive for vomiting. Negative for abdominal pain, bowel incontinence, diarrhea, heartburn and nausea.  Genitourinary:  Negative for bladder incontinence, dysuria and pelvic pain.  Musculoskeletal:  Negative for falls.   Neurological:  Positive for headaches (started yesterday, frontal bilateral). Negative for tingling, tremors, sensory change, speech change, focal weakness, seizures, weakness, numbness and paresthesias.        Objective:    BP 128/71   Pulse 74   Temp 97.7 F (36.5 C) (Temporal)   Ht 5' 5"$  (1.651 m)   Wt 205 lb 4 oz (93.1 kg)   LMP 10/30/2018 (Approximate) Comment: total hysterectmy  SpO2 97%   BMI 34.16 kg/m    Physical Exam Vitals and nursing note reviewed.  Constitutional:      General: She is not in acute distress.    Appearance: Normal appearance. She is not ill-appearing, toxic-appearing or diaphoretic.  Cardiovascular:     Rate and Rhythm: Normal rate and regular rhythm.     Heart sounds: Normal heart sounds. No murmur heard. Pulmonary:     Effort: Pulmonary effort is normal. No respiratory distress.     Breath sounds: Normal breath sounds.  Abdominal:     General: Bowel sounds are normal. There is no distension.     Palpations: Abdomen is soft.     Tenderness: There is no abdominal tenderness. There is no right CVA tenderness, left CVA tenderness, guarding or rebound.  Musculoskeletal:     Cervical back: Normal.     Thoracic back: Normal.     Lumbar back: Tenderness (right paraspinal) present. No swelling, edema, deformity, signs of trauma or bony tenderness. Normal range of motion. Positive right straight leg raise test.     Right lower leg: No edema.     Left lower leg: No edema.  Skin:    General: Skin  is warm and dry.  Neurological:     General: No focal deficit present.     Mental Status: She is alert and oriented to person, place, and time.  Psychiatric:        Mood and Affect: Mood normal.        Behavior: Behavior normal.        Thought Content: Thought content normal.        Judgment: Judgment normal.     No results found for any visits on 06/22/22.      Assessment & Plan:   Iyahna was seen today for nausea, headache and back  pain.  Diagnoses and all orders for this visit:  Acute right-sided low back pain without sciatica Seen at [facility] 2 days ago with negative xray, UA. Given toradol injection and vicodin. No red flags. Discussed strain. Steroid IM injection today. Start prednisone burst tomorrow. Mobic daily, do not take other NSAIDs with this. Handout given with stretches. Rest, heat.  -     methylPREDNISolone acetate (DEPO-MEDROL) injection 80 mg -     predniSONE (DELTASONE) 20 MG tablet; Take 2 tablets (40 mg total) by mouth daily with breakfast for 5 days. Start tomorrow. -     meloxicam (MOBIC) 15 MG tablet; Take 1 tablet (15 mg total) by mouth daily.  Nausea Benign exam. Likely due to pain. Zofran prn. Discussed return precautions.  -     ondansetron (ZOFRAN) 4 MG tablet; Take 1-2 tablets (4-8 mg total) by mouth every 8 (eight) hours as needed for nausea or vomiting.  Acute non intractable tension-type headache Increase hydration. Discussed symptomatic care and return precautions.    Return to office for new or worsening symptoms, or if symptoms persist.   The patient indicates understanding of these issues and agrees with the plan.  Gwenlyn Perking, FNP

## 2022-06-22 NOTE — Telephone Encounter (Signed)
Patient seen 02/21 with Rebecca [name] and problem was discussed.

## 2022-06-22 NOTE — Patient Instructions (Signed)
Back Exercises The following exercises strengthen the muscles that help to support the trunk (torso) and back. They also help to keep the lower back flexible. Doing these exercises can help to prevent or lessen existing low back pain. If you have back pain or discomfort, try doing these exercises 2-3 times each day or as told by your health care provider. As your pain improves, do them once each day, but increase the number of times that you repeat the steps for each exercise (do more repetitions). To prevent the recurrence of back pain, continue to do these exercises once each day or as told by your health care provider. Do exercises exactly as told by your health care provider and adjust them as directed. It is normal to feel mild stretching, pulling, tightness, or discomfort as you do these exercises, but you should stop right away if you feel sudden pain or your pain gets worse. Exercises Single knee to chest Repeat these steps 3-5 times for each leg: Lie on your back on a firm bed or the floor with your legs extended. Bring one knee to your chest. Your other leg should stay extended and in contact with the floor. Hold your knee in place by grabbing your knee or thigh with both hands and hold. Pull on your knee until you feel a gentle stretch in your lower back or buttocks. Hold the stretch for 10-30 seconds. Slowly release and straighten your leg.  Pelvic tilt Repeat these steps 5-10 times: Lie on your back on a firm bed or the floor with your legs extended. Bend your knees so they are pointing toward the ceiling and your feet are flat on the floor. Tighten your lower abdominal muscles to press your lower back against the floor. This motion will tilt your pelvis so your tailbone points up toward the ceiling instead of pointing to your feet or the floor. With gentle tension and even breathing, hold this position for 5-10 seconds.  Cat-cow Repeat these steps until your lower back becomes  more flexible: Get into a hands-and-knees position on a firm bed or the floor. Keep your hands under your shoulders, and keep your knees under your hips. You may place padding under your knees for comfort. Let your head hang down toward your chest. Contract your abdominal muscles and point your tailbone toward the floor so your lower back becomes rounded like the back of a cat. Hold this position for 5 seconds. Slowly lift your head, let your abdominal muscles relax, and point your tailbone up toward the ceiling so your back forms a sagging arch like the back of a cow. Hold this position for 5 seconds.  Press-ups Repeat these steps 5-10 times: Lie on your abdomen (face-down) on a firm bed or the floor. Place your palms near your head, about shoulder-width apart. Keeping your back as relaxed as possible and keeping your hips on the floor, slowly straighten your arms to raise the top half of your body and lift your shoulders. Do not use your back muscles to raise your upper torso. You may adjust the placement of your hands to make yourself more comfortable. Hold this position for 5 seconds while you keep your back relaxed. Slowly return to lying flat on the floor.  Bridges Repeat these steps 10 times: Lie on your back on a firm bed or the floor. Bend your knees so they are pointing toward the ceiling and your feet are flat on the floor. Your arms should be flat   at your sides, next to your body. Tighten your buttocks muscles and lift your buttocks off the floor until your waist is at almost the same height as your knees. You should feel the muscles working in your buttocks and the back of your thighs. If you do not feel these muscles, slide your feet 1-2 inches (2.5-5 cm) farther away from your buttocks. Hold this position for 3-5 seconds. Slowly lower your hips to the starting position, and allow your buttocks muscles to relax completely. If this exercise is too easy, try doing it with your arms  crossed over your chest. Abdominal crunches Repeat these steps 5-10 times: Lie on your back on a firm bed or the floor with your legs extended. Bend your knees so they are pointing toward the ceiling and your feet are flat on the floor. Cross your arms over your chest. Tip your chin slightly toward your chest without bending your neck. Tighten your abdominal muscles and slowly raise your torso high enough to lift your shoulder blades a tiny bit off the floor. Avoid raising your torso higher than that because it can put too much stress on your lower back and does not help to strengthen your abdominal muscles. Slowly return to your starting position.  Back lifts Repeat these steps 5-10 times: Lie on your abdomen (face-down) with your arms at your sides, and rest your forehead on the floor. Tighten the muscles in your legs and your buttocks. Slowly lift your chest off the floor while you keep your hips pressed to the floor. Keep the back of your head in line with the curve in your back. Your eyes should be looking at the floor. Hold this position for 3-5 seconds. Slowly return to your starting position.  Contact a health care provider if: Your back pain or discomfort gets much worse when you do an exercise. Your worsening back pain or discomfort does not lessen within 2 hours after you exercise. If you have any of these problems, stop doing these exercises right away. Do not do them again unless your health care provider says that you can. Get help right away if: You develop sudden, severe back pain. If this happens, stop doing the exercises right away. Do not do them again unless your health care provider says that you can. This information is not intended to replace advice given to you by your health care provider. Make sure you discuss any questions you have with your health care provider. Document Revised: 10/13/2020 Document Reviewed: 07/01/2020 Elsevier Patient Education  [place].  Lumbar Sprain A lumbar sprain, which is sometimes called a low-back sprain, is a stretch or tear in the ligaments in the lower back (lumbar spine). Ligaments are the bands of tissue that connect bones to each other. This type of injury occurs when you stretch the ligaments beyond their limits. Lumbar sprains can range from mild to severe. Mild sprains may involve stretching a ligament without tearing it. These may heal in 1-2 weeks. More severe sprains involve tearing of the ligament. These will cause more pain and may take 6-8 weeks to heal. What are the causes? This condition may be caused by: Trauma, such as a fall or a hit to the body. Twisting or overstretching the back. This may result from doing activities that take a lot of energy, such as lifting heavy objects. What increases the risk? A lumbar sprain is more common in: Athletes. People with obesity. People who do repeated lifting, bending, or  other movements that involve their back. What are the signs or symptoms? Symptoms of this condition may include: Sharp or dull pain in the lower back that does not go away. The pain may spread to the buttocks. Stiffness or limited range of motion. Sudden muscle tightening (spasms). How is this diagnosed? This condition may be diagnosed based on: Your symptoms. Your medical history. A physical exam. Imaging tests, such as: X-rays. MRI. How is this treated? Treatment for this condition may include: Resting the injured area. Applying heat and cold to the affected area. Over-the-counter medicines for pain and inflammation, such as NSAIDs. Prescription medicine for pain or to relax the muscles. These may be needed for a short time. Physical therapy exercises to improve movement and strength. Follow these instructions at home: Managing pain, stiffness, and swelling     If told, put ice on the injured area during the first 24 hours after your injury. Put ice in a plastic  bag. Place a towel between your skin and the bag. Leave the ice on for 20 minutes, 2-3 times a day. If told, apply heat to the affected area as often as told by your health care provider. Use the heat source that your health care provider recommends, such as a moist heat pack or a heating pad. Place a towel between your skin and the heat source. Leave the heat on for 20-30 minutes. If your skin turns bright red, remove the ice or heat right away to prevent skin damage. The risk of damage is higher if you cannot feel pain, heat, or cold. Activity Rest and return to your normal activities as told by your health care provider. Ask your health care provider what activities are safe for you. Do exercises as told by your health care provider. General instructions Take over-the-counter and prescription medicines only as told by your health care provider. Ask your health care provider if the medicine prescribed to you: Requires you to avoid driving or using machinery. Can cause constipation. You may need to take these actions to prevent or treat constipation: Drink enough fluid to keep your urine pale yellow. Take over-the-counter or prescription medicines. Eat foods that are high in fiber, such as beans, whole grains, and fresh fruits and vegetables. Limit foods that are high in fat and processed sugars, such as fried or sweet foods. Do not use any products that contain nicotine or tobacco. These products include cigarettes, chewing tobacco, and vaping devices, such as e-cigarettes. If you need help quitting, ask your health care provider. How is this prevented? To prevent a future low-back injury: Always warm up properly before physical activity or sports. Cool down and stretch after being active. Use correct form when playing sports and lifting heavy objects. Bend your knees before you lift heavy objects. Use good posture when sitting and standing. Stay physically fit and keep a healthy  weight. Do at least 150 minutes of moderate-intensity exercise each week, such as brisk walking or water aerobics. Do strength exercises at least 2 times each week. Contact a health care provider if: Your back pain does not improve after several weeks of treatment. Your symptoms get worse. You have a fever. Get help right away if: Your back pain is severe. You are unable to stand or walk. You develop pain in your legs. You have weakness in your buttocks or legs. You have trouble controlling when you urinate or when you have a bowel movement. You have frequent, painful, or bloody urination. This information is  not intended to replace advice given to you by your health care provider. Make sure you discuss any questions you have with your health care provider. Document Revised: 11/09/2021 Document Reviewed: 11/09/2021 Elsevier Patient Education  [place].

## 2022-06-23 LAB — CMP14+EGFR
ALT: 32 IU/L (ref 0–32)
AST: 19 IU/L (ref 0–40)
Albumin/Globulin Ratio: 1.7 (ref 1.2–2.2)
Albumin: 4.7 g/dL (ref 3.9–4.9)
Alkaline Phosphatase: 95 IU/L (ref 44–121)
BUN/Creatinine Ratio: 13 (ref 9–23)
BUN: 11 mg/dL (ref 6–24)
Bilirubin Total: 0.5 mg/dL (ref 0.0–1.2)
CO2: 20 mmol/L (ref 20–29)
Calcium: 9.7 mg/dL (ref 8.7–10.2)
Chloride: 99 mmol/L (ref 96–106)
Creatinine, Ser: 0.82 mg/dL (ref 0.57–1.00)
Globulin, Total: 2.8 g/dL (ref 1.5–4.5)
Glucose: 106 mg/dL — ABNORMAL HIGH (ref 70–99)
Potassium: 3.8 mmol/L (ref 3.5–5.2)
Sodium: 138 mmol/L (ref 134–144)
Total Protein: 7.5 g/dL (ref 6.0–8.5)
eGFR: 88 mL/min/{1.73_m2} (ref >59)

## 2022-06-23 LAB — HORMONE PANEL (T4,TSH,FSH,TESTT,SHBG,DHEA,ETC)
DHEA-Sulfate, LCMS: 123 ug/dL
Estradiol, Serum, MS: 39 pg/mL
Estrone Sulfate: 1490 ng/dL — ABNORMAL HIGH
Follicle Stimulating Hormone: 54 m[IU]/mL
Free T-3: 3.1 pg/mL
Free Testosterone, Serum: 1.8 pg/mL
Progesterone, Serum: <10 ng/dL
Sex Hormone Binding Globulin: 41 nmol/L
T4: 8.1 ug/dL
TSH: 1.2 uU/mL
Testosterone, Serum (Total): 14 ng/dL
Testosterone-% Free: 1.3 %
Triiodothyronine (T-3), Serum: 108 ng/dL

## 2022-06-23 LAB — CBC WITH DIFFERENTIAL/PLATELET
Basophils Absolute: 0 10*3/uL (ref 0.0–0.2)
Basos: 0 %
EOS (ABSOLUTE): 0.1 10*3/uL (ref 0.0–0.4)
Eos: 1 %
Hematocrit: 44.6 % (ref 34.0–46.6)
Hemoglobin: 14.7 g/dL (ref 11.1–15.9)
Immature Grans (Abs): 0 10*3/uL (ref 0.0–0.1)
Immature Granulocytes: 0 %
Lymphocytes Absolute: 2.8 10*3/uL (ref 0.7–3.1)
Lymphs: 39 %
MCH: 29.9 pg (ref 26.6–33.0)
MCHC: 33 g/dL (ref 31.5–35.7)
MCV: 91 fL (ref 79–97)
Monocytes Absolute: 0.4 10*3/uL (ref 0.1–0.9)
Monocytes: 5 %
Neutrophils Absolute: 3.8 10*3/uL (ref 1.4–7.0)
Neutrophils: 55 %
Platelets: 316 10*3/uL (ref 150–450)
RBC: 4.91 x10E6/uL (ref 3.77–5.28)
RDW: 13.3 % (ref 11.7–15.4)
WBC: 7.1 10*3/uL (ref 3.4–10.8)

## 2022-06-24 ENCOUNTER — Encounter: Payer: Self-pay | Admitting: Family Medicine

## 2022-06-25 ENCOUNTER — Ambulatory Visit (HOSPITAL_BASED_OUTPATIENT_CLINIC_OR_DEPARTMENT_OTHER): Payer: BC Managed Care – PPO

## 2022-07-11 ENCOUNTER — Ambulatory Visit (INDEPENDENT_AMBULATORY_CARE_PROVIDER_SITE_OTHER): Payer: BC Managed Care – PPO | Admitting: Orthopedic Surgery

## 2022-07-11 ENCOUNTER — Encounter: Payer: Self-pay | Admitting: Orthopedic Surgery

## 2022-07-11 DIAGNOSIS — S46101A Unspecified injury of muscle, fascia and tendon of long head of biceps, right arm, initial encounter: Secondary | ICD-10-CM

## 2022-07-11 DIAGNOSIS — M19011 Primary osteoarthritis, right shoulder: Secondary | ICD-10-CM

## 2022-07-11 NOTE — Progress Notes (Signed)
   Post-Op Visit Note   Patient: Rebecca [name]           Date of Birth: Jun 26, 1970           MRN: [id number] Visit Date: 07/11/2022 PCP: Baruch Gouty, FNP   Assessment & Plan:  Chief Complaint:  Chief Complaint  Patient presents with   Right Shoulder - Follow-up   Visit Diagnoses:  1. Arthritis of right acromioclavicular joint   2. Injury of tendon of long head of right biceps, initial encounter     Plan: Rebecca [name] is a patient who underwent right shoulder arthroscopy biceps tenodesis subacromial decompression 04/19/2022.  She has to do a lot of lifting at work.  No meds for pain.  Sore with some external rotation.  On exam she has range of motion of 70/110/170.  Plan at this time is okay to return to work 318 with 10 pound lifting limit and no overhead lifting for 4 weeks and then we can increase that to 25 pounds for the next 4 weeks.  4-week return for follow-up and final check at that time.  Follow-Up Instructions: Return in about 4 weeks (around 08/08/2022).   Orders:  No orders of the defined types were placed in this encounter.  No orders of the defined types were placed in this encounter.   Imaging: No results found.  PMFS History: Patient Active Problem List   Diagnosis Date Noted   Biceps tendinitis of right upper extremity 04/28/2022   Degenerative superior labral anterior-to-posterior (SLAP) tear of right shoulder 04/28/2022   Chronic right shoulder pain 09/10/2021   Depression, major, single episode, mild ([place]) 04/28/2021   GAD (generalized anxiety disorder) 04/28/2021   Insomnia 01/14/2021   Mixed hyperlipidemia 01/14/2021   BMI 35.0-35.9,adult 01/14/2021   History of total hysterectomy 01/14/2021   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   Past Medical History:  Diagnosis Date   Anxiety    Difficult intubation 11/09/2011   uneventful intubation using elective [facility] MAC 3 video laryngoscope 10/29/19 given prior history   Hyperlipidemia     Insomnia     Family History  Problem Relation Age of Onset   Depression Mother    Heart disease Mother    Diabetes Father    Hyperlipidemia Father    Cancer Maternal Grandfather    Diabetes Paternal Grandmother    Breast cancer Neg Hx     Past Surgical History:  Procedure Laterality Date   ABDOMINAL HYSTERECTOMY  11/09/2011   hysterectomy with right ooporectomy   BREAST EXCISIONAL BIOPSY Left    OOPHORECTOMY Left 10/29/2019   right ooporectomy 11/09/11; left oophorectoy 10/29/19 for ovarian torsion   SHOULDER ARTHROSCOPY WITH SUBACROMIAL DECOMPRESSION AND BICEP TENDON REPAIR Right 04/19/2022   Procedure: RIGHT SHOULDER ARTHROSCOPY, SUBACROMIAL DECOMPRESSION,  BICEPS TENODESIS AND DEBRIDEMENT;  Surgeon: Meredith Pel, MD;  Location: [facility];  Service: Orthopedics;  Laterality: Right;   TONSILLECTOMY Bilateral    Social History   Occupational History   Occupation: [employer]  Tobacco Use   Smoking status: Never   Smokeless tobacco: Never  Vaping Use   Vaping Use: Never used  Substance and Sexual Activity   Alcohol use: Yes    Comment: 1-2 drinks per week   Drug use: Never   Sexual activity: Yes    Comment: total hysterectomy

## 2022-07-12 ENCOUNTER — Telehealth: Payer: Self-pay

## 2022-07-12 NOTE — Telephone Encounter (Signed)
Pls make sure follow up appt is scheduled per Dr Randel Pigg note.

## 2022-07-12 NOTE — Telephone Encounter (Signed)
-----   Message from Meredith Pel, MD sent at 07/11/2022  7:46 PM EDT ----- Corrin Parker can you have this person follow-up with [facility] in 4 weeks.  Thanks

## 2022-07-19 ENCOUNTER — Other Ambulatory Visit: Payer: Self-pay | Admitting: Family Medicine

## 2022-07-19 DIAGNOSIS — M545 Low back pain, unspecified: Secondary | ICD-10-CM

## 2022-08-03 ENCOUNTER — Encounter: Payer: Self-pay | Admitting: Family Medicine

## 2022-08-03 ENCOUNTER — Ambulatory Visit: Payer: BC Managed Care – PPO | Admitting: Family Medicine

## 2022-08-03 VITALS — BP 122/86 | HR 65 | Temp 98.0°F | Ht 65.0 in | Wt 209.4 lb

## 2022-08-03 DIAGNOSIS — E8941 Symptomatic postprocedural ovarian failure: Secondary | ICD-10-CM | POA: Diagnosis not present

## 2022-08-03 DIAGNOSIS — F411 Generalized anxiety disorder: Secondary | ICD-10-CM

## 2022-08-03 DIAGNOSIS — F5101 Primary insomnia: Secondary | ICD-10-CM | POA: Diagnosis not present

## 2022-08-03 DIAGNOSIS — R7309 Other abnormal glucose: Secondary | ICD-10-CM

## 2022-08-03 DIAGNOSIS — Z6835 Body mass index (BMI) 35.0-35.9, adult: Secondary | ICD-10-CM

## 2022-08-03 DIAGNOSIS — F320 Major depressive disorder, single episode, mild: Secondary | ICD-10-CM | POA: Diagnosis not present

## 2022-08-03 DIAGNOSIS — E782 Mixed hyperlipidemia: Secondary | ICD-10-CM

## 2022-08-03 MED ORDER — ESTRADIOL 0.5 MG PO TABS
0.5000 mg | ORAL_TABLET | ORAL | 3 refills | Status: DC
Start: 2022-08-03 — End: 2022-12-15

## 2022-08-03 MED ORDER — TRAZODONE HCL 50 MG PO TABS: 100.0000 mg | ORAL_TABLET | Freq: Every day | ORAL | 1 refills | Status: DC

## 2022-08-03 NOTE — Progress Notes (Signed)
Subjective:  Patient ID: Rebecca [name], female    DOB: [birth year], 52 y.o.   MRN: [id number]  Patient Care Team: Baruch Gouty, FNP as PCP - General (Family Medicine)   Chief Complaint:  Hot Flashes (Patient states her hot flashes and emotional state came back after she was taken off estradiol. ) and Sleeping Problem   HPI: Rebecca [name] is a 52 y.o. female presenting on 08/03/2022 for Hot Flashes (Patient states her hot flashes and emotional state came back after she was taken off estradiol. ) and Sleeping Problem   1. Hot flashes due to surgical menopause 2. Depression, major, single episode, mild 3. GAD (generalized anxiety disorder) Was on estradiol which was recently discontinued due to elevated estrogen levels. States she has had a return of depression and anxiety symptoms along with worsening hot flashes since discontinuing the medications.  Has tried bupropion in the past for hot flashes but this was not beneficial.     06/22/2022   10:57 AM 01/18/2022    4:13 PM 12/16/2021    8:47 AM 12/16/2021    8:45 AM 10/08/2021    8:11 AM  Depression screen PHQ 2/9  Decreased Interest 0 0 0 0 0  Down, Depressed, Hopeless 0 0 0 0 0  PHQ - 2 Score 0 0 0 0 0  Altered sleeping 0 2 2 0 0  Tired, decreased energy 0 0 2 0 1  Change in appetite 0 0 1 0 0  Feeling bad or failure about yourself  0 0 0 0 0  Trouble concentrating 0 0 0 0 0  Moving slowly or fidgety/restless 0 0 0 0 0  Suicidal thoughts 0 0 0  0  PHQ-9 Score 0 2 5 0 1  Difficult doing work/chores Not difficult at all Not difficult at all Not difficult at all Not difficult at all Not difficult at all      06/22/2022   10:58 AM 01/18/2022    4:14 PM 12/16/2021    8:45 AM 10/08/2021    8:11 AM  GAD 7 : Generalized Anxiety Score  Nervous, Anxious, on Edge 0 0 0 0  Control/stop worrying 0 0 0 0  Worry too much - different things 0 0 0 0  Trouble relaxing 0 0 0 0  Restless 0 0 0 0  Easily annoyed or irritable 0 0  0 0  Afraid - awful might happen 0 0 0 0  Total GAD 7 Score 0 0 0 0  Anxiety Difficulty Not difficult at all  Not difficult at all Not difficult at all    4. Primary insomnia Was on trazodone in the past and this worked well. Was on doxepin but reports this stopped working.   5. BMI 35.0-35.9,adult Insurance will no longer cover weight loss medications. Has been trying apps for assistance with weight loss without results.  6. Mixed hyperlipidemia On statin therapy and tolerating well. Denies myalgias.      Relevant past medical, surgical, family, and social history reviewed and updated as indicated.  Allergies and medications reviewed and updated. Data reviewed: Chart in Epic.   Past Medical History:  Diagnosis Date   Anxiety    Difficult intubation 11/09/2011   uneventful intubation using elective McGrath MAC 3 video laryngoscope 10/29/19 given prior history   Hyperlipidemia    Insomnia     Past Surgical History:  Procedure Laterality Date   ABDOMINAL HYSTERECTOMY  11/09/2011   hysterectomy with right ooporectomy  BREAST EXCISIONAL BIOPSY Left    OOPHORECTOMY Left 10/29/2019   right ooporectomy 11/09/11; left oophorectoy 10/29/19 for ovarian torsion   SHOULDER ARTHROSCOPY WITH SUBACROMIAL DECOMPRESSION AND BICEP TENDON REPAIR Right 04/19/2022   Procedure: RIGHT SHOULDER ARTHROSCOPY, SUBACROMIAL DECOMPRESSION,  BICEPS TENODESIS AND DEBRIDEMENT;  Surgeon: Meredith Pel, MD;  Location: [facility];  Service: Orthopedics;  Laterality: Right;   TONSILLECTOMY Bilateral     Social History   Socioeconomic History   Marital status: Married    Spouse name: Not on file   Number of children: Not on file   Years of education: Not on file   Highest education level: Bachelor's degree (e.g., BA, AB, BS)  Occupational History   Occupation: [employer]  Tobacco Use   Smoking status: Never   Smokeless tobacco: Never  Vaping Use   Vaping Use: Never used  Substance  and Sexual Activity   Alcohol use: Yes    Comment: 1-2 drinks per week   Drug use: Never   Sexual activity: Yes    Comment: total hysterectomy  Other Topics Concern   Not on file  Social History Narrative   Not on file   Social Determinants of Health   Financial Resource Strain: Not on file  Food Insecurity: No Food Insecurity (08/02/2022)   Hunger Vital Sign    Worried About Running Out of Food in the Last Year: Never true    Ran Out of Food in the Last Year: Never true  Transportation Needs: No Transportation Needs (08/02/2022)   PRAPARE - Hydrologist (Medical): No    Lack of Transportation (Non-Medical): No  Physical Activity: Sufficiently Active (08/02/2022)   Exercise Vital Sign    Days of Exercise per Week: 6 days    Minutes of Exercise per Session: 30 min  Stress: Stress Concern Present (08/02/2022)   [place]    Feeling of Stress : To some extent  Social Connections: Unknown (08/02/2022)   Social Connection and Isolation Panel [NHANES]    Frequency of Communication with Friends and Family: Once a week    Frequency of Social Gatherings with Friends and Family: Patient declined    Attends Religious Services: Patient declined    Marine scientist or Organizations: No    Attends Music therapist: Not on file    Marital Status: Married  Intimate Partner Violence: Not on file    Outpatient Encounter Medications as of 08/03/2022  Medication Sig   atorvastatin (LIPITOR) 20 MG tablet Take 1 tablet (20 mg total) by mouth daily.   estradiol (ESTRACE) 0.5 MG tablet Take 1 tablet (0.5 mg total) by mouth 3 (three) times a week.   fluticasone (FLONASE) 50 MCG/ACT nasal spray SPRAY 2 SPRAYS INTO EACH NOSTRIL EVERY DAY   meloxicam (MOBIC) 15 MG tablet TAKE 1 TABLET (15 MG TOTAL) BY MOUTH DAILY.   ondansetron (ZOFRAN) 4 MG tablet Take 1-2 tablets (4-8 mg total) by mouth every 8  (eight) hours as needed for nausea or vomiting.   [DISCONTINUED] doxepin (SINEQUAN) 10 MG capsule Take 10 mg by mouth at bedtime.   [DISCONTINUED] doxepin (SINEQUAN) 25 MG capsule TAKE 1 CAPSULE BY MOUTH EVERYDAY AT BEDTIME   [DISCONTINUED] traZODone (DESYREL) 50 MG tablet Take 50 mg by mouth at bedtime.   traZODone (DESYREL) 50 MG tablet Take 2 tablets (100 mg total) by mouth at bedtime.   [DISCONTINUED] baclofen (LIORESAL) 20 MG tablet Take  20 mg by mouth 3 (three) times daily.   [DISCONTINUED] estradiol (ESTRACE) 0.5 MG tablet Take 1 tablet (0.5 mg total) by mouth daily. (NEEDS TO BE SEEN BEFORE NEXT REFILL)   [DISCONTINUED] HYDROcodone-acetaminophen (NORCO/VICODIN) 5-325 MG tablet Take by mouth.   No facility-administered encounter medications on file as of 08/03/2022.    No Known Allergies  Review of Systems  Constitutional:  Positive for activity change, appetite change, diaphoresis and fatigue. Negative for chills, fever and unexpected weight change.  HENT: Negative.    Eyes: Negative.  Negative for photophobia and visual disturbance.  Respiratory:  Negative for cough, chest tightness and shortness of breath.   Cardiovascular:  Negative for chest pain, palpitations and leg swelling.  Gastrointestinal:  Negative for abdominal pain, blood in stool, constipation, diarrhea, nausea and vomiting.  Endocrine: Negative.   Genitourinary:  Negative for decreased urine volume, difficulty urinating, dysuria, frequency and urgency.  Musculoskeletal:  Negative for arthralgias and myalgias.  Skin: Negative.   Allergic/Immunologic: Negative.   Neurological:  Negative for dizziness and headaches.  Hematological: Negative.   Psychiatric/Behavioral:  Negative for agitation, behavioral problems, confusion, decreased concentration, dysphoric mood, hallucinations, self-injury, sleep disturbance and suicidal ideas. The patient is not nervous/anxious and is not hyperactive.   All other systems reviewed and  are negative.       Objective:  BP 122/86   Pulse 65   Temp 98 F (36.7 C) (Temporal)   Ht 5\' 5"  (1.651 m)   Wt 209 lb 6.4 oz (95 kg)   LMP 10/30/2018 (Approximate) Comment: total hysterectmy  SpO2 97%   BMI 34.85 kg/m    Wt Readings from Last 3 Encounters:  08/03/22 209 lb 6.4 oz (95 kg)  06/22/22 205 lb 4 oz (93.1 kg)  04/19/22 198 lb (89.8 kg)    Physical Exam Vitals and nursing note reviewed.  Constitutional:      General: She is not in acute distress.    Appearance: Normal appearance. She is well-developed and well-groomed. She is obese. She is not ill-appearing, toxic-appearing or diaphoretic.  HENT:     Head: Normocephalic and atraumatic.     Jaw: There is normal jaw occlusion.     Right Ear: Hearing normal.     Left Ear: Hearing normal.     Nose: Nose normal.     Mouth/Throat:     Lips: Pink.     Mouth: Mucous membranes are moist.     Pharynx: Uvula midline.  Eyes:     General: Lids are normal.     Conjunctiva/sclera: Conjunctivae normal.     Pupils: Pupils are equal, round, and reactive to light.  Neck:     Trachea: Trachea and phonation normal.  Cardiovascular:     Rate and Rhythm: Normal rate and regular rhythm.     Chest Wall: PMI is not displaced.     Heart sounds: Normal heart sounds. No murmur heard.    No friction rub. No gallop.  Pulmonary:     Effort: Pulmonary effort is normal. No respiratory distress.     Breath sounds: Normal breath sounds. No wheezing.  Abdominal:     General: There is no abdominal bruit.     Palpations: There is no hepatomegaly or splenomegaly.  Musculoskeletal:        General: Normal range of motion.     Cervical back: Normal range of motion and neck supple.     Right lower leg: No edema.     Left lower leg: No  edema.  Skin:    General: Skin is warm and dry.     Capillary Refill: Capillary refill takes less than 2 seconds.     Coloration: Skin is not cyanotic, jaundiced or pale.     Findings: No rash.   Neurological:     General: No focal deficit present.     Mental Status: She is alert and oriented to person, place, and time.     Sensory: Sensation is intact.     Motor: Motor function is intact.     Coordination: Coordination is intact.     Gait: Gait is intact.     Deep Tendon Reflexes: Reflexes are normal and symmetric.  Psychiatric:        Attention and Perception: Attention and perception normal.        Mood and Affect: Mood and affect normal.        Speech: Speech normal.        Behavior: Behavior normal. Behavior is cooperative.        Thought Content: Thought content normal.        Cognition and Memory: Cognition and memory normal.        Judgment: Judgment normal.     Results for orders placed or performed in visit on 06/01/22  CMP14+EGFR  Result Value Ref Range   Glucose 106 (H) 70 - 99 mg/dL   BUN 11 6 - 24 mg/dL   Creatinine, Ser 0.82 0.57 - 1.00 mg/dL   eGFR 88 >59 mL/min/1.73   BUN/Creatinine Ratio 13 9 - 23   Sodium 138 134 - 144 mmol/L   Potassium 3.8 3.5 - 5.2 mmol/L   Chloride 99 96 - 106 mmol/L   CO2 20 20 - 29 mmol/L   Calcium 9.7 8.7 - 10.2 mg/dL   Total Protein 7.5 6.0 - 8.5 g/dL   Albumin 4.7 3.9 - 4.9 g/dL   Globulin, Total 2.8 1.5 - 4.5 g/dL   Albumin/Globulin Ratio 1.7 1.2 - 2.2   Bilirubin Total 0.5 0.0 - 1.2 mg/dL   Alkaline Phosphatase 95 44 - 121 IU/L   AST 19 0 - 40 IU/L   ALT 32 0 - 32 IU/L  CBC with Differential/Platelet  Result Value Ref Range   WBC 7.1 3.4 - 10.8 x10E3/uL   RBC 4.91 3.77 - 5.28 x10E6/uL   Hemoglobin 14.7 11.1 - 15.9 g/dL   Hematocrit 44.6 34.0 - 46.6 %   MCV 91 79 - 97 fL   MCH 29.9 26.6 - 33.0 pg   MCHC 33.0 31.5 - 35.7 g/dL   RDW 13.3 11.7 - 15.4 %   Platelets 316 150 - 450 x10E3/uL   Neutrophils 55 Not Estab. %   Lymphs 39 Not Estab. %   Monocytes 5 Not Estab. %   Eos 1 Not Estab. %   Basos 0 Not Estab. %   Neutrophils Absolute 3.8 1.4 - 7.0 x10E3/uL   Lymphocytes Absolute 2.8 0.7 - 3.1 x10E3/uL    Monocytes Absolute 0.4 0.1 - 0.9 x10E3/uL   EOS (ABSOLUTE) 0.1 0.0 - 0.4 x10E3/uL   Basophils Absolute 0.0 0.0 - 0.2 x10E3/uL   Immature Granulocytes 0 Not Estab. %   Immature Grans (Abs) 0.0 0.0 - 0.1 x10E3/uL  Hormone Panel  Result Value Ref Range   TSH 1.2 uU/mL   T4 8.1 ug/dL   Free T-3 3.1 pg/mL   Triiodothyronine (T-3), Serum 108 ng/dL   Testosterone, Serum (Total) 14 ng/dL   Free Testosterone, Serum 1.8 pg/mL  Testosterone-% Free 1.3 %   Follicle Stimulating Hormone 54 mIU/mL   Progesterone, Serum <10 ng/dL   DHEA-Sulfate, LCMS 123 ug/dL   Sex Hormone Binding Globulin 41.0 nmol/L   Estrone Sulfate 1,490 (H) ng/dL   Estradiol, Serum, MS 39 pg/mL       Pertinent labs & imaging results that were available during my care of the patient were reviewed by me and considered in my medical decision making.  Assessment & Plan:  Rebecca [name] was seen today for hot flashes and sleeping problem.  Diagnoses and all orders for this visit:  Hot flashes due to surgical menopause Will restart estradiol at three times weekly dosing. Labs pending. Will refer to GYN for management if this is not beneficial.  -     estradiol (ESTRACE) 0.5 MG tablet; Take 1 tablet (0.5 mg total) by mouth 3 (three) times a week. -     Hormone Panel; Future -     CBC with Differential/Platelet; Future -     CMP14+EGFR; Future -     Thyroid Panel With TSH; Future  Depression, major, single episode, mild GAD (generalized anxiety disorder) Scores zero on screening but reports symptoms have worsening being off of HRT. Labs pending.  -     Hormone Panel; Future -     Thyroid Panel With TSH; Future  Primary insomnia Did well with below in the pas, will trial again. Sleep hygiene discussed in detail.  -     traZODone (DESYREL) 50 MG tablet; Take 2 tablets (100 mg total) by mouth at bedtime. -     Hormone Panel; Future -     Thyroid Panel With TSH; Future  BMI 35.0-35.9,adult Diet and exercise encouraged. Referral to  weight management placed. Labs pending.  -     Amb Ref to Medical Weight Management -     Hormone Panel; Future -     CBC with Differential/Platelet; Future -     CMP14+EGFR; Future -     Lipid panel; Future -     Thyroid Panel With TSH; Future -     Bayer DCA Hb A1c Waived; Future  Elevated glucose Will repeat labs today.  -     CMP14+EGFR; Future -     Bayer DCA Hb A1c Waived; Future  Mixed hyperlipidemia Diet encouraged - increase intake of fresh fruits and vegetables, increase intake of lean proteins. Bake, broil, or grill foods. Avoid fried, greasy, and fatty foods. Avoid fast foods. Increase intake of fiber-rich whole grains. Exercise encouraged - at least 150 minutes per week and advance as tolerated.  Goal BMI < 25. Continue medications as prescribed. Follow up in 3-6 months as discussed.  -     CMP14+EGFR; Future -     Lipid panel; Future     Continue all other maintenance medications.  Follow up plan: Return in about 3 months (around 11/02/2022), or if symptoms worsen or fail to improve, for estrogen.   Continue healthy lifestyle choices, including diet (rich in fruits, vegetables, and lean proteins, and low in salt and simple carbohydrates) and exercise (at least 30 minutes of moderate physical activity daily).   The above assessment and management plan was discussed with the patient. The patient verbalized understanding of and has agreed to the management plan. Patient is aware to call the clinic if they develop any new symptoms or if symptoms persist or worsen. Patient is aware when to return to the clinic for a follow-up visit. Patient educated on when it  is appropriate to go to the emergency department.   Monia Pouch, FNP-C [facility] [phone]

## 2022-08-04 ENCOUNTER — Encounter: Payer: Self-pay | Admitting: Family Medicine

## 2022-08-05 MED ORDER — TRAZODONE HCL 50 MG PO TABS
200.0000 mg | ORAL_TABLET | Freq: Every day | ORAL | 1 refills | Status: DC
Start: 2022-08-05 — End: 2022-10-14

## 2022-08-05 NOTE — Addendum Note (Signed)
Addended by: Sonny Masters on: 08/05/2022 08:54 AM   Modules accepted: Orders

## 2022-08-08 ENCOUNTER — Ambulatory Visit: Payer: BC Managed Care – PPO | Admitting: Surgical

## 2022-08-12 ENCOUNTER — Ambulatory Visit: Payer: BC Managed Care – PPO | Admitting: Family Medicine

## 2022-08-14 ENCOUNTER — Other Ambulatory Visit: Payer: Self-pay | Admitting: Family Medicine

## 2022-08-14 DIAGNOSIS — E782 Mixed hyperlipidemia: Secondary | ICD-10-CM

## 2022-08-16 ENCOUNTER — Encounter: Payer: Self-pay | Admitting: Nurse Practitioner

## 2022-08-16 ENCOUNTER — Ambulatory Visit: Payer: BC Managed Care – PPO | Admitting: Nurse Practitioner

## 2022-08-16 ENCOUNTER — Encounter: Payer: Self-pay | Admitting: Family Medicine

## 2022-08-16 VITALS — BP 120/79 | HR 64 | Temp 97.9°F | Ht 65.0 in | Wt 209.0 lb

## 2022-08-16 DIAGNOSIS — E782 Mixed hyperlipidemia: Secondary | ICD-10-CM | POA: Diagnosis not present

## 2022-08-16 DIAGNOSIS — E669 Obesity, unspecified: Secondary | ICD-10-CM | POA: Diagnosis not present

## 2022-08-16 DIAGNOSIS — Z6834 Body mass index (BMI) 34.0-34.9, adult: Secondary | ICD-10-CM

## 2022-08-16 NOTE — Progress Notes (Signed)
Office: [phone]  /  Fax: [phone]   Initial Visit  Rebecca [name] was seen in clinic today to evaluate for obesity. She is interested in losing weight to improve overall health and reduce the risk of weight related complications. She presents today to review program treatment options, initial physical assessment, and evaluation.     She was referred by: PCP  When asked what else they would like to accomplish? She states: Improve appearance and Lose a target amount of weight : Goal weight: 170 llbs  Weight history:  She started gaining weight in 2012 after having a partial hysterectomy and started gaining excess weight after having her complete hysterectomy 3 years ago.    When asked how has your weight affected you? She states: Contributed to orthopedic problems or mobility issues and Problems with depression and or anxiety  Some associated conditions:  depression, insomnia, HLD, constipation and incontinence    Contributing factors: Family history, Medications, Stress, and Menopause  Her mother had bariatric surgery.    Weight promoting medications identified: Steroids and Contraceptives or hormonal therapy  Current nutrition plan: None  Current level of physical activity: Walking  Limited on upper body due to right shoulder surgery.  Current or previous pharmacotherapy: Theodis Sato (stopped due to side effects).    Response to medication: Lost weight and was able to maintain weight loss   Past medical history includes:   Past Medical History:  Diagnosis Date   Anxiety    Difficult intubation 11/09/2011   uneventful intubation using elective McGrath MAC 3 video laryngoscope 10/29/19 given prior history   Hyperlipidemia    Insomnia      Objective:   BP 120/79   Pulse 64   Temp 97.9 F (36.6 C)   Ht  (1.651 m)   Wt 209 lb (94.8 kg)   LMP 10/30/2018 (Approximate) Comment: total hysterectmy  SpO2 99%   BMI 34.78 kg/m  She was weighed on the  bioimpedance scale: Body mass index is 34.78 kg/m.  Peak Weight:210 lbs , Body Fat%:42, Visceral Fat Rating:11, Weight trend over the last 12 months: Increasing  General:  Alert, oriented and cooperative. Patient is in no acute distress.  Respiratory: Normal respiratory effort, no problems with respiration noted   Gait: able to ambulate independently  Mental Status: Normal mood and affect. Normal behavior. Normal judgment and thought content.   DIAGNOSTIC DATA REVIEWED:  BMET    Component Value Date/Time   NA 138 06/01/2022 1051   K 3.8 06/01/2022 1051   CL 99 06/01/2022 1051   CO2 20 06/01/2022 1051   GLUCOSE 106 (H) 06/01/2022 1051   GLUCOSE 115 (H) 03/23/2022 1422   BUN 11 06/01/2022 1051   CREATININE 0.82 06/01/2022 1051   CALCIUM 9.7 06/01/2022 1051   GFRNONAA >60 03/23/2022 1422   Lab Results  Component Value Date   HGBA1C 5.5 01/14/2021   No results found for: "INSULIN" CBC    Component Value Date/Time   WBC 7.1 06/01/2022 1051   WBC 7.8 03/23/2022 1422   RBC 4.91 06/01/2022 1051   RBC 4.58 03/23/2022 1422   HGB 14.7 06/01/2022 1051   HCT 44.6 06/01/2022 1051   PLT 316 06/01/2022 1051   MCV 91 06/01/2022 1051   MCH 29.9 06/01/2022 1051   MCH 31.0 03/23/2022 1422   MCHC 33.0 06/01/2022 1051   MCHC 33.9 03/23/2022 1422   RDW 13.3 06/01/2022 1051   Iron/TIBC/Ferritin/ %Sat No results found for: "IRON", "TIBC", "FERRITIN", "IRONPCTSAT" Lipid  Panel     Component Value Date/Time   CHOL 157 01/18/2022 1630   TRIG 152 (H) 01/18/2022 1630   HDL 46 01/18/2022 1630   CHOLHDL 3.4 01/18/2022 1630   LDLCALC 85 01/18/2022 1630   Hepatic Function Panel     Component Value Date/Time   PROT 7.5 06/01/2022 1051   ALBUMIN 4.7 06/01/2022 1051   AST 19 06/01/2022 1051   ALT 32 06/01/2022 1051   ALKPHOS 95 06/01/2022 1051   BILITOT 0.5 06/01/2022 1051      Component Value Date/Time   TSH 1.550 01/18/2022 1630     Assessment and Plan:   Mixed  hyperlipidemia Continue to follow up with PCP.  Continue meds as directed.    Generalized obesity  BMI 34.0-34.9,adult        Obesity Treatment / Action Plan:  Patient will work on garnering support from family and friends to begin weight loss journey. Will work on eliminating or reducing the presence of highly palatable, calorie dense foods in the home. Will complete provided nutritional and psychosocial assessment questionnaire before the next appointment. Will be scheduled for indirect calorimetry to determine resting energy expenditure in a fasting state.  This will allow [place] to create a reduced calorie, high-protein meal plan to promote loss of fat mass while preserving muscle mass.  Obesity Education Performed Today:  She was weighed on the bioimpedance scale and results were discussed and documented in the synopsis.  We discussed obesity as a disease and the importance of a more detailed evaluation of all the factors contributing to the disease.  We discussed the importance of long term lifestyle changes which include nutrition, exercise and behavioral modifications as well as the importance of customizing this to her specific health and social needs.  We discussed the benefits of reaching a healthier weight to alleviate the symptoms of existing conditions and reduce the risks of the biomechanical, metabolic and psychological effects of obesity.  Sadye Kulaga appears to be in the action stage of change and states they are ready to start intensive lifestyle modifications and behavioral modifications.  30 minutes was spent today on this visit including the above counseling, pre-visit chart review, and post-visit documentation.  Reviewed by clinician on day of visit: allergies, medications, problem list, medical history, surgical history, family history, social history, and previous encounter notes pertinent to obesity diagnosis.    Theodis Sato [place] FNP-C

## 2022-08-17 NOTE — Addendum Note (Signed)
Addended by: Sonny Masters on: 08/17/2022 04:36 PM   Modules accepted: Orders

## 2022-08-26 ENCOUNTER — Ambulatory Visit: Payer: BC Managed Care – PPO | Admitting: Surgical

## 2022-08-29 ENCOUNTER — Encounter: Payer: Self-pay | Admitting: Family Medicine

## 2022-08-30 ENCOUNTER — Other Ambulatory Visit: Payer: Self-pay | Admitting: Family Medicine

## 2022-08-30 DIAGNOSIS — N393 Stress incontinence (female) (male): Secondary | ICD-10-CM

## 2022-08-30 MED ORDER — MIRABEGRON ER 25 MG PO TB24
25.0000 mg | ORAL_TABLET | Freq: Every day | ORAL | 1 refills | Status: DC
Start: 2022-08-30 — End: 2022-08-31

## 2022-08-31 NOTE — Telephone Encounter (Signed)
  GEMTESA 75 MG TABS        Changed from: mirabegron ER (MYRBETRIQ) 25 MG TB24 tablet   Pharmacy comment: Alternative Requested:NON FORMULARY DRUG; PLEASE CONTACT INSURANCE OR CONSIDER ALTERNATIVE.

## 2022-09-05 ENCOUNTER — Ambulatory Visit: Payer: BC Managed Care – PPO | Admitting: Surgical

## 2022-09-07 ENCOUNTER — Encounter: Payer: Self-pay | Admitting: Family Medicine

## 2022-09-13 ENCOUNTER — Other Ambulatory Visit: Payer: Self-pay | Admitting: Family Medicine

## 2022-09-13 ENCOUNTER — Encounter: Payer: Self-pay | Admitting: Family Medicine

## 2022-09-13 DIAGNOSIS — J069 Acute upper respiratory infection, unspecified: Secondary | ICD-10-CM

## 2022-09-13 DIAGNOSIS — J301 Allergic rhinitis due to pollen: Secondary | ICD-10-CM

## 2022-09-13 MED ORDER — CETIRIZINE HCL 10 MG PO TABS
10.0000 mg | ORAL_TABLET | Freq: Every day | ORAL | 11 refills | Status: DC
Start: 2022-09-13 — End: 2023-08-15

## 2022-09-17 ENCOUNTER — Other Ambulatory Visit: Payer: Self-pay | Admitting: Family Medicine

## 2022-09-17 DIAGNOSIS — F5101 Primary insomnia: Secondary | ICD-10-CM

## 2022-09-17 DIAGNOSIS — F320 Major depressive disorder, single episode, mild: Secondary | ICD-10-CM

## 2022-09-17 DIAGNOSIS — F411 Generalized anxiety disorder: Secondary | ICD-10-CM

## 2022-09-22 DIAGNOSIS — Z0289 Encounter for other administrative examinations: Secondary | ICD-10-CM

## 2022-10-01 ENCOUNTER — Encounter: Payer: Self-pay | Admitting: Bariatrics

## 2022-10-14 ENCOUNTER — Other Ambulatory Visit: Payer: Self-pay | Admitting: Family Medicine

## 2022-10-14 DIAGNOSIS — F5101 Primary insomnia: Secondary | ICD-10-CM

## 2022-10-15 ENCOUNTER — Other Ambulatory Visit: Payer: Self-pay | Admitting: Family Medicine

## 2022-10-15 DIAGNOSIS — E782 Mixed hyperlipidemia: Secondary | ICD-10-CM

## 2022-10-18 ENCOUNTER — Encounter: Payer: Self-pay | Admitting: Bariatrics

## 2022-10-18 ENCOUNTER — Ambulatory Visit (INDEPENDENT_AMBULATORY_CARE_PROVIDER_SITE_OTHER): Payer: BC Managed Care – PPO | Admitting: Bariatrics

## 2022-10-18 VITALS — BP 121/84 | HR 72 | Temp 97.5°F | Ht 65.0 in | Wt 213.0 lb

## 2022-10-18 DIAGNOSIS — E7849 Other hyperlipidemia: Secondary | ICD-10-CM

## 2022-10-18 DIAGNOSIS — Z1331 Encounter for screening for depression: Secondary | ICD-10-CM | POA: Insufficient documentation

## 2022-10-18 DIAGNOSIS — R0602 Shortness of breath: Secondary | ICD-10-CM | POA: Diagnosis not present

## 2022-10-18 DIAGNOSIS — R7309 Other abnormal glucose: Secondary | ICD-10-CM

## 2022-10-18 DIAGNOSIS — E559 Vitamin D deficiency, unspecified: Secondary | ICD-10-CM | POA: Insufficient documentation

## 2022-10-18 DIAGNOSIS — Z6835 Body mass index (BMI) 35.0-35.9, adult: Secondary | ICD-10-CM

## 2022-10-18 DIAGNOSIS — Z0000 Encounter for general adult medical examination without abnormal findings: Secondary | ICD-10-CM

## 2022-10-18 DIAGNOSIS — R5383 Other fatigue: Secondary | ICD-10-CM | POA: Diagnosis not present

## 2022-10-18 DIAGNOSIS — E669 Obesity, unspecified: Secondary | ICD-10-CM | POA: Insufficient documentation

## 2022-10-18 DIAGNOSIS — F32A Depression, unspecified: Secondary | ICD-10-CM

## 2022-10-18 DIAGNOSIS — E88819 Insulin resistance, unspecified: Secondary | ICD-10-CM | POA: Insufficient documentation

## 2022-10-19 ENCOUNTER — Other Ambulatory Visit: Payer: Self-pay | Admitting: Family Medicine

## 2022-10-19 ENCOUNTER — Encounter (INDEPENDENT_AMBULATORY_CARE_PROVIDER_SITE_OTHER): Payer: Self-pay | Admitting: Bariatrics

## 2022-10-19 DIAGNOSIS — R7401 Elevation of levels of liver transaminase levels: Secondary | ICD-10-CM | POA: Insufficient documentation

## 2022-10-19 DIAGNOSIS — N393 Stress incontinence (female) (male): Secondary | ICD-10-CM

## 2022-10-19 LAB — COMPREHENSIVE METABOLIC PANEL
ALT: 66 IU/L — ABNORMAL HIGH (ref 0–32)
AST: 32 IU/L (ref 0–40)
Albumin: 4.5 g/dL (ref 3.9–4.9)
Alkaline Phosphatase: 76 IU/L (ref 44–121)
BUN/Creatinine Ratio: 9 (ref 9–23)
BUN: 7 mg/dL (ref 6–24)
Bilirubin Total: 0.3 mg/dL (ref 0.0–1.2)
CO2: 21 mmol/L (ref 20–29)
Calcium: 9.5 mg/dL (ref 8.7–10.2)
Chloride: 102 mmol/L (ref 96–106)
Creatinine, Ser: 0.82 mg/dL (ref 0.57–1.00)
Globulin, Total: 2.5 g/dL (ref 1.5–4.5)
Glucose: 89 mg/dL (ref 70–99)
Potassium: 4.7 mmol/L (ref 3.5–5.2)
Sodium: 139 mmol/L (ref 134–144)
Total Protein: 7 g/dL (ref 6.0–8.5)
eGFR: 88 mL/min/{1.73_m2} (ref >59)

## 2022-10-19 LAB — LIPID PANEL WITH LDL/HDL RATIO
Cholesterol, Total: 212 mg/dL — ABNORMAL HIGH (ref 100–199)
HDL: 49 mg/dL (ref >39)
LDL Chol Calc (NIH): 126 mg/dL — ABNORMAL HIGH (ref 0–99)
LDL/HDL Ratio: 2.6 ratio (ref 0.0–3.2)
Triglycerides: 211 mg/dL — ABNORMAL HIGH (ref 0–149)
VLDL Cholesterol Cal: 37 mg/dL (ref 5–40)

## 2022-10-19 LAB — TSH+T4F+T3FREE
Free T4: 1.05 ng/dL (ref 0.82–1.77)
T3, Free: 3.2 pg/mL (ref 2.0–4.4)
TSH: 1.63 u[IU]/mL (ref 0.450–4.500)

## 2022-10-19 LAB — VITAMIN D 25 HYDROXY (VIT D DEFICIENCY, FRACTURES): Vit D, 25-Hydroxy: 31.5 ng/mL (ref 30.0–100.0)

## 2022-10-19 LAB — INSULIN, RANDOM: INSULIN: 25.3 u[IU]/mL — ABNORMAL HIGH (ref 2.6–24.9)

## 2022-10-19 LAB — HEMOGLOBIN A1C
Est. average glucose Bld gHb Est-mCnc: 111 mg/dL
Hgb A1c MFr Bld: 5.5 % (ref 4.8–5.6)

## 2022-10-19 NOTE — Progress Notes (Unsigned)
Chief Complaint:   OBESITY Rebecca [name] (MR# [id number]) is a 52 y.o. female who presents for evaluation and treatment of obesity and related comorbidities. Current BMI is Body mass index is 35.45 kg/m. Rebecca [name] has been struggling with her weight for many years and has been unsuccessful in either losing weight, maintaining weight loss, or reaching her healthy weight goal.  Rebecca [name] is currently in the action stage of change and ready to dedicate time achieving and maintaining a healthier weight. Rebecca [name] is interested in becoming our patient and working on intensive lifestyle modifications including (but not limited to) diet and exercise for weight loss.  Patient states that she likes to cook.  She snacks at night.  She frequently skips breakfast.  She had an information session on 08/24/2022 with Judeth Cornfield, nurse practitioner.  Rebecca [name]'s habits were reviewed today and are as follows: Her family eats meals together, she thinks her family will eat healthier with her, her desired weight loss is 43-53 lbs, she started gaining weight after her hysterectomy, her heaviest weight ever was 212 pounds, she has significant food cravings issues, she snacks frequently in the evenings, she skips meals frequently, she is frequently drinking liquids with calories, she frequently makes poor food choices, she frequently eats larger portions than normal, and she struggles with emotional eating.  Depression Screen Rebecca [name]'s Food and Mood (modified PHQ-9) score was 13.  Subjective:   1. Other fatigue Rebecca [name] denies daytime somnolence and admits to waking up still tired. Patient has a history of symptoms of morning fatigue and morning headache. Rebecca [name]  is unsure of the amount of  hours of sleep per night, and states that she has nightime awakenings. Snoring is present. Apneic episodes are not present. Epworth Sleepiness Score is 0.   2. SOB (shortness of breath) on exertion Rebecca [name] notes increasing shortness of breath with  exercising and seems to be worsening over time with weight gain. She notes getting out of breath sooner with activity than she used to. This has not gotten worse recently. Rebecca [name] denies shortness of breath at rest or orthopnea.  3. Other hyperlipidemia Patient is taking atorvastatin.  4. Elevated glucose Patient has a paternal family history with her grandmother.  5. Vitamin D deficiency Patient's last vitamin D level was >50.  6. Health care maintenance Given obesity.   Assessment/Plan:   1. Other fatigue Rebecca [name] does feel that her weight is causing her energy to be lower than it should be. Fatigue may be related to obesity, depression or many other causes. Labs will be ordered, and in the meanwhile, Rebecca [name] will focus on self care including making healthy food choices, increasing physical activity and focusing on stress reduction.  - EKG 12-Lead - TSH+T4F+T3Free  2. SOB (shortness of breath) on exertion Rebecca [name] does feel that she gets out of breath more easily that she used to when she exercises. Rebecca [name]'s shortness of breath appears to be obesity related and exercise induced. She has agreed to work on weight loss and gradually increase exercise to treat her exercise induced shortness of breath. Will continue to monitor closely.  - TSH+T4F+T3Free  3. Other hyperlipidemia We will check labs today, and patient will continue her medications as directed.  - Lipid Panel With LDL/HDL Ratio  4. Elevated glucose We will check labs today, and we will follow-up at her next visit.  - Insulin, random - Hemoglobin A1c  5. Vitamin D deficiency We will check labs today, we will follow-up at her next visit.  -  VITAMIN D 25 Hydroxy (Vit-D Deficiency, Fractures)  6. Health care maintenance We will check labs today. EKG and IC results were reviewed with the patient today.   - TSH+T4F+T3Free - VITAMIN D 25 Hydroxy (Vit-D Deficiency, Fractures) - Lipid Panel With LDL/HDL Ratio - Insulin, random -  Hemoglobin A1c - Comprehensive metabolic panel  7. Depression screening Rebecca [name] had a positive depression screening. Depression is commonly associated with obesity and often results in emotional eating behaviors. We will monitor this closely and work on CBT to help improve the non-hunger eating patterns. Referral to Psychology may be required if no improvement is seen as she continues in our clinic.  8. Generalized obesity  9. BMI 35.0-35.9,adult Rebecca [name] is currently in the action stage of change and her goal is to continue with weight loss efforts. I recommend Rebecca [name] begin the structured treatment plan as follows:  She has agreed to the Category 2 Plan + 100 calories.  Meal planning was discussed.  Review labs with the patient today from 06/01/2022, CMP, CBC, glucose, and thyroid panel.  Patient will work on portion sizes.  Eating out handout was given.  Exercise goals: Pickle ball.    Behavioral modification strategies: increasing lean protein intake, decreasing simple carbohydrates, increasing vegetables, increasing water intake, decreasing eating out, no skipping meals, meal planning and cooking strategies, keeping healthy foods in the home, and planning for success.  She was informed of the importance of frequent follow-up visits to maximize her success with intensive lifestyle modifications for her multiple health conditions. She was informed we would discuss her lab results at her next visit unless there is a critical issue that needs to be addressed sooner. Rebecca [name] agreed to keep her next visit at the agreed upon time to discuss these results.  Objective:   Blood pressure 121/84, pulse 72, temperature (!) 97.5 F (36.4 C), height 5\' 5"  (1.651 m), weight 213 lb (96.6 kg), last menstrual period 10/30/2018, SpO2 97 %. Body mass index is 35.45 kg/m.  EKG: Normal sinus rhythm, rate 77 BPM.  Indirect Calorimeter completed today shows a VO2 of 250 and a REE of 1728.  Her calculated basal metabolic rate  is 1709 thus her basal metabolic rate is better than expected.  General: Cooperative, alert, well developed, in no acute distress. HEENT: Conjunctivae and lids unremarkable. Cardiovascular: Regular rhythm.  Lungs: Normal work of breathing. Neurologic: No focal deficits.   Lab Results  Component Value Date   CREATININE 0.82 10/18/2022   BUN 7 10/18/2022   NA 139 10/18/2022   K 4.7 10/18/2022   CL 102 10/18/2022   CO2 21 10/18/2022   Lab Results  Component Value Date   ALT 66 (H) 10/18/2022   AST 32 10/18/2022   ALKPHOS 76 10/18/2022   BILITOT 0.3 10/18/2022   Lab Results  Component Value Date   HGBA1C 5.5 10/18/2022   HGBA1C 5.5 01/14/2021   Lab Results  Component Value Date   INSULIN 25.3 (H) 10/18/2022   Lab Results  Component Value Date   TSH 1.630 10/18/2022   Lab Results  Component Value Date   CHOL 212 (H) 10/18/2022   HDL 49 10/18/2022   LDLCALC 126 (H) 10/18/2022   TRIG 211 (H) 10/18/2022   CHOLHDL 3.4 01/18/2022   Lab Results  Component Value Date   WBC 7.1 06/01/2022   HGB 14.7 06/01/2022   HCT 44.6 06/01/2022   MCV 91 06/01/2022   PLT 316 06/01/2022   No results found for: "IRON", "TIBC", "FERRITIN"  Attestation Statements:   Reviewed by clinician on day of visit: allergies, medications, problem list, medical history, surgical history, family history, social history, and previous encounter notes.    Trude Mcburney, am acting as Energy manager for [employer], DO.  I have reviewed the above documentation for accuracy and completeness, and I agree with the above. Corinna Capra, DO

## 2022-10-20 ENCOUNTER — Encounter: Payer: Self-pay | Admitting: Bariatrics

## 2022-11-10 ENCOUNTER — Other Ambulatory Visit: Payer: Self-pay | Admitting: Family Medicine

## 2022-11-10 ENCOUNTER — Ambulatory Visit: Payer: BC Managed Care – PPO | Admitting: Bariatrics

## 2022-11-10 ENCOUNTER — Encounter: Payer: Self-pay | Admitting: Bariatrics

## 2022-11-10 VITALS — BP 119/81 | HR 64 | Temp 97.9°F | Ht 65.0 in | Wt 209.0 lb

## 2022-11-10 DIAGNOSIS — J069 Acute upper respiratory infection, unspecified: Secondary | ICD-10-CM

## 2022-11-10 DIAGNOSIS — R632 Polyphagia: Secondary | ICD-10-CM

## 2022-11-10 DIAGNOSIS — E559 Vitamin D deficiency, unspecified: Secondary | ICD-10-CM

## 2022-11-10 DIAGNOSIS — E88819 Insulin resistance, unspecified: Secondary | ICD-10-CM

## 2022-11-10 DIAGNOSIS — R7401 Elevation of levels of liver transaminase levels: Secondary | ICD-10-CM | POA: Diagnosis not present

## 2022-11-10 DIAGNOSIS — Z6834 Body mass index (BMI) 34.0-34.9, adult: Secondary | ICD-10-CM

## 2022-11-10 DIAGNOSIS — E7800 Pure hypercholesterolemia, unspecified: Secondary | ICD-10-CM

## 2022-11-10 DIAGNOSIS — E669 Obesity, unspecified: Secondary | ICD-10-CM

## 2022-11-10 MED ORDER — VITAMIN D (ERGOCALCIFEROL) 1.25 MG (50000 UNIT) PO CAPS
50000.0000 [IU] | ORAL_CAPSULE | ORAL | 0 refills | Status: DC
Start: 2022-11-10 — End: 2022-12-15

## 2022-11-10 MED ORDER — QSYMIA 7.5-46 MG PO CP24
ORAL_CAPSULE | ORAL | 0 refills | Status: DC
Start: 2022-11-10 — End: 2022-12-15

## 2022-11-10 MED ORDER — QSYMIA 3.75-23 MG PO CP24
ORAL_CAPSULE | ORAL | 0 refills | Status: DC
Start: 2022-11-10 — End: 2022-12-15

## 2022-11-14 ENCOUNTER — Telehealth: Payer: Self-pay

## 2022-11-14 NOTE — Progress Notes (Unsigned)
Chief Complaint:   OBESITY Rebecca [name] is here to discuss her progress with her obesity treatment plan along with follow-up of her obesity related diagnoses. Rebecca [name] is on {MWMwtlossportion/plan2:23431} and states she is following her eating plan approximately ***% of the time. Rebecca [name] states she is *** *** minutes *** times per week.  Today's visit was #: *** Starting weight: *** Starting date: *** Today's weight: *** Today's date: 11/10/2022 Total lbs lost to date: *** Total lbs lost since last in-office visit: ***  Interim History: ***  Subjective:   1. Elevated cholesterol ***  2. Elevated ALT measurement ***  3. Vitamin D deficiency ***  4. Insulin resistance ***  5. Polyphagia ***  Assessment/Plan:   1. Elevated cholesterol ***  2. Elevated ALT measurement ***  3. Vitamin D deficiency *** - Vitamin D, Ergocalciferol, (DRISDOL) 1.25 MG (50000 UNIT) CAPS capsule; Take 1 capsule (50,000 Units total) by mouth every 7 (seven) days.  Dispense: 5 capsule; Refill: 0  4. Insulin resistance ***  5. Polyphagia *** - Phentermine-Topiramate (QSYMIA) 3.75-23 MG CP24; 1 capsule daily  Dispense: 15 capsule; Refill: 0 - Phentermine-Topiramate (QSYMIA) 7.5-46 MG CP24; 1 capsule daily  Dispense: 30 capsule; Refill: 0  6. Generalized obesity  7. BMI 34.0-34.9,adult Rebecca [name] is currently in the action stage of change. As such, her goal is to continue with weight loss efforts. She has agreed to the Category 2 Plan.   Exercise goals: No exercise has been prescribed at this time.  Behavioral modification strategies: increasing lean protein intake, decreasing simple carbohydrates, increasing vegetables, increasing water intake, decreasing eating out, no skipping meals, meal planning and cooking strategies, keeping healthy foods in the home, and planning for success.  Rebecca [name] has agreed to follow-up with our clinic in 2 weeks. She was informed of the importance of frequent follow-up visits to  maximize her success with intensive lifestyle modifications for her multiple health conditions.   Objective:   Blood pressure 119/81, pulse 64, temperature 97.9 F (36.6 C), height 5\' 5"  (1.651 m), weight 209 lb (94.8 kg), last menstrual period 10/30/2018, SpO2 100%. Body mass index is 34.78 kg/m.  General: Cooperative, alert, well developed, in no acute distress. HEENT: Conjunctivae and lids unremarkable. Cardiovascular: Regular rhythm.  Lungs: Normal work of breathing. Neurologic: No focal deficits.   Lab Results  Component Value Date   CREATININE 0.82 10/18/2022   BUN 7 10/18/2022   NA 139 10/18/2022   K 4.7 10/18/2022   CL 102 10/18/2022   CO2 21 10/18/2022   Lab Results  Component Value Date   ALT 66 (H) 10/18/2022   AST 32 10/18/2022   ALKPHOS 76 10/18/2022   BILITOT 0.3 10/18/2022   Lab Results  Component Value Date   HGBA1C 5.5 10/18/2022   HGBA1C 5.5 01/14/2021   Lab Results  Component Value Date   INSULIN 25.3 (H) 10/18/2022   Lab Results  Component Value Date   TSH 1.630 10/18/2022   Lab Results  Component Value Date   CHOL 212 (H) 10/18/2022   HDL 49 10/18/2022   LDLCALC 126 (H) 10/18/2022   TRIG 211 (H) 10/18/2022   CHOLHDL 3.4 01/18/2022   Lab Results  Component Value Date   VD25OH 31.5 10/18/2022   Lab Results  Component Value Date   WBC 7.1 06/01/2022   HGB 14.7 06/01/2022   HCT 44.6 06/01/2022   MCV 91 06/01/2022   PLT 316 06/01/2022   No results found for: "IRON", "TIBC", "FERRITIN"  Attestation  Statements:   Reviewed by clinician on day of visit: allergies, medications, problem list, medical history, surgical history, family history, social history, and previous encounter notes.   Rebecca [name], am acting as Energy manager for [employer], DO.  I have reviewed the above documentation for accuracy and completeness, and I agree with the above. -  ***

## 2022-11-14 NOTE — Telephone Encounter (Signed)
Started PA for Qsymia via covermymeds. 

## 2022-11-16 NOTE — Telephone Encounter (Signed)
Qsymia approved 11/14/22-02/14/23 ref/case # [id number]

## 2022-11-17 ENCOUNTER — Other Ambulatory Visit: Payer: BC Managed Care – PPO

## 2022-11-17 DIAGNOSIS — E8941 Symptomatic postprocedural ovarian failure: Secondary | ICD-10-CM

## 2022-11-17 DIAGNOSIS — R7309 Other abnormal glucose: Secondary | ICD-10-CM

## 2022-11-17 DIAGNOSIS — F411 Generalized anxiety disorder: Secondary | ICD-10-CM

## 2022-11-17 DIAGNOSIS — Z6835 Body mass index (BMI) 35.0-35.9, adult: Secondary | ICD-10-CM

## 2022-11-17 DIAGNOSIS — E782 Mixed hyperlipidemia: Secondary | ICD-10-CM

## 2022-11-17 DIAGNOSIS — F320 Major depressive disorder, single episode, mild: Secondary | ICD-10-CM

## 2022-11-17 DIAGNOSIS — F5101 Primary insomnia: Secondary | ICD-10-CM

## 2022-11-17 LAB — BAYER DCA HB A1C WAIVED: HB A1C (BAYER DCA - WAIVED): 5.3 % (ref 4.8–5.6)

## 2022-11-18 LAB — CBC WITH DIFFERENTIAL/PLATELET
Basophils Absolute: 0 10*3/uL (ref 0.0–0.2)
Basos: 0 %
EOS (ABSOLUTE): 0.1 10*3/uL (ref 0.0–0.4)
Eos: 1 %
Hematocrit: 44 % (ref 34.0–46.6)
Hemoglobin: 14.9 g/dL (ref 11.1–15.9)
Immature Grans (Abs): 0 10*3/uL (ref 0.0–0.1)
Immature Granulocytes: 0 %
Lymphocytes Absolute: 2.9 10*3/uL (ref 0.7–3.1)
Lymphs: 41 %
MCH: 31.2 pg (ref 26.6–33.0)
MCHC: 33.9 g/dL (ref 31.5–35.7)
MCV: 92 fL (ref 79–97)
Monocytes Absolute: 0.5 10*3/uL (ref 0.1–0.9)
Monocytes: 7 %
Neutrophils Absolute: 3.7 10*3/uL (ref 1.4–7.0)
Neutrophils: 51 %
Platelets: 319 10*3/uL (ref 150–450)
RBC: 4.78 x10E6/uL (ref 3.77–5.28)
RDW: 12.6 % (ref 11.7–15.4)
WBC: 7.2 10*3/uL (ref 3.4–10.8)

## 2022-11-18 LAB — LIPID PANEL
Chol/HDL Ratio: 4 ratio (ref 0.0–4.4)
Cholesterol, Total: 164 mg/dL (ref 100–199)
HDL: 41 mg/dL (ref >39)
LDL Chol Calc (NIH): 95 mg/dL (ref 0–99)
Triglycerides: 159 mg/dL — ABNORMAL HIGH (ref 0–149)
VLDL Cholesterol Cal: 28 mg/dL (ref 5–40)

## 2022-11-18 LAB — HORMONE PANEL (T4,TSH,FSH,TESTT,SHBG,DHEA,ETC)

## 2022-11-18 LAB — CMP14+EGFR
ALT: 48 IU/L — ABNORMAL HIGH (ref 0–32)
AST: 27 IU/L (ref 0–40)
Albumin: 4.8 g/dL (ref 3.9–4.9)
Alkaline Phosphatase: 87 IU/L (ref 44–121)
BUN/Creatinine Ratio: 12 (ref 9–23)
BUN: 11 mg/dL (ref 6–24)
Bilirubin Total: 0.4 mg/dL (ref 0.0–1.2)
CO2: 24 mmol/L (ref 20–29)
Calcium: 10 mg/dL (ref 8.7–10.2)
Chloride: 100 mmol/L (ref 96–106)
Creatinine, Ser: 0.9 mg/dL (ref 0.57–1.00)
Globulin, Total: 2.6 g/dL (ref 1.5–4.5)
Glucose: 97 mg/dL (ref 70–99)
Potassium: 4.2 mmol/L (ref 3.5–5.2)
Sodium: 139 mmol/L (ref 134–144)
Total Protein: 7.4 g/dL (ref 6.0–8.5)
eGFR: 78 mL/min/{1.73_m2} (ref >59)

## 2022-11-18 LAB — THYROID PANEL WITH TSH
Free Thyroxine Index: 1.8 (ref 1.2–4.9)
T3 Uptake Ratio: 26 % (ref 24–39)
T4, Total: 7.1 ug/dL (ref 4.5–12.0)
TSH: 1.8 u[IU]/mL (ref 0.450–4.500)

## 2022-11-28 ENCOUNTER — Encounter: Payer: Self-pay | Admitting: Family Medicine

## 2022-12-01 ENCOUNTER — Ambulatory Visit: Payer: BC Managed Care – PPO | Admitting: Bariatrics

## 2022-12-05 ENCOUNTER — Encounter: Payer: Self-pay | Admitting: Family Medicine

## 2022-12-09 ENCOUNTER — Other Ambulatory Visit: Payer: Self-pay | Admitting: Family Medicine

## 2022-12-09 DIAGNOSIS — N393 Stress incontinence (female) (male): Secondary | ICD-10-CM

## 2022-12-15 ENCOUNTER — Encounter: Payer: Self-pay | Admitting: Bariatrics

## 2022-12-15 ENCOUNTER — Ambulatory Visit: Payer: BC Managed Care – PPO | Admitting: Bariatrics

## 2022-12-15 VITALS — BP 114/78 | HR 79 | Temp 98.5°F | Ht 65.0 in | Wt 200.0 lb

## 2022-12-15 DIAGNOSIS — E559 Vitamin D deficiency, unspecified: Secondary | ICD-10-CM | POA: Diagnosis not present

## 2022-12-15 DIAGNOSIS — Z6833 Body mass index (BMI) 33.0-33.9, adult: Secondary | ICD-10-CM | POA: Diagnosis not present

## 2022-12-15 DIAGNOSIS — E669 Obesity, unspecified: Secondary | ICD-10-CM | POA: Diagnosis not present

## 2022-12-15 DIAGNOSIS — K5900 Constipation, unspecified: Secondary | ICD-10-CM | POA: Diagnosis not present

## 2022-12-15 MED ORDER — VITAMIN D (ERGOCALCIFEROL) 1.25 MG (50000 UNIT) PO CAPS: 50000.0000 [IU] | ORAL_CAPSULE | ORAL | 0 refills | Status: DC

## 2022-12-15 NOTE — Progress Notes (Signed)
WEIGHT SUMMARY AND BIOMETRICS  Weight Lost Since Last Visit: 9lb  Weight Gained Since Last Visit: 0lb   Vitals Temp: 98.5 F (36.9 C) BP: 114/78 Pulse Rate: 79 SpO2: 95 %   Anthropometric Measurements Height: 5\' 5"  (1.651 m) Weight: 200 lb (90.7 kg) BMI (Calculated): 33.28 Weight at Last Visit: 209lb Weight Lost Since Last Visit: 9lb Weight Gained Since Last Visit: 0lb Starting Weight: 213lb Total Weight Loss (lbs): 13 lb (5.897 kg)   Body Composition  Body Fat %: 40 % Fat Mass (lbs): 80.2 lbs Muscle Mass (lbs): 114.2 lbs Total Body Water (lbs): 79.8 lbs Visceral Fat Rating : 10   Other Clinical Data Fasting: No Labs: No Today's Visit #: 3 Starting Date: 10/18/22    OBESITY Norvella is here to discuss her progress with her obesity treatment plan along with follow-up of her obesity related diagnoses.     Nutrition Plan: the Category 2 plan + 100- 95-98% adherence.  Current exercise: walking  Interim History:  She is down 9 lbs since her last visit.  Protein intake is as prescribed, Is not skipping meals, and Water intake is adequate.  Pharmacotherapy: She was prescribed Qsymia but has decided not to take the medication due to issues related to sleep. No other medications prescribed at this time.  Hunger is moderately controlled.  Cravings are moderately controlled.  Assessment/Plan:   Vitamin D Deficiency Vitamin D is not at goal of 50.  Most recent vitamin D level was 31.5. She is on  prescription ergocalciferol 50,000 IU weekly. Lab Results  Component Value Date   VD25OH 31.5 10/18/2022    Plan: Refill prescription vitamin D 50,000 IU weekly.    Generalized Obesity: Current BMI BMI (Calculated): 33.28   Constipation Nolene notes constipation.   She is trying to eat more fiber , and has taken a stool softener, and a fiber supplement.  Constipation is poorly controlled.   Plan: Increase fiber up to 30 to 35 grams of fiber slowly.   Increase water intake to at least 64 ounces daily.  Add Metamucil or Citrucel daily. Add magnesium citrate daily at 400 mg daily.  Can continue her stool softener Begin MOM as needed OTC.      Annel is currently in the action stage of change. As such, her goal is to continue with weight loss efforts.  She has agreed to the Category 2 plan.  Exercise goals: For substantial health benefits, adults should do at least 150 minutes (2 hours and 30 minutes) a week of moderate-intensity, or 75 minutes (1 hour and 15 minutes) a week of vigorous-intensity aerobic physical activity, or an equivalent combination of moderate- and vigorous-intensity aerobic activity. Aerobic activity should be performed in episodes of at least 10 minutes, and preferably, it should be spread throughout the week.  Behavioral modification strategies: increasing lean protein intake, no meal skipping, meal planning , increase water intake, planning for success, increasing fiber rich foods, and mindful eating.  Shanisha has agreed to follow-up with our clinic in 2 weeks.       Objective:   VITALS: Per patient if applicable, see vitals. GENERAL: Alert and in no acute distress. CARDIOPULMONARY: No increased WOB. Speaking in clear sentences.  PSYCH: Pleasant and cooperative. Speech normal rate and rhythm. Affect is appropriate. Insight and judgement are appropriate. Attention is focused, linear, and appropriate.  NEURO: Oriented as arrived to appointment on time with no prompting.   Attestation Statements:    This was prepared with the  assistance of Engineer, civil (consulting).  Occasional wrong-word or sound-a-like substitutions may have occurred due to the inherent limitations of voice recognition software.   Corinna Capra, DO

## 2023-01-07 ENCOUNTER — Other Ambulatory Visit: Payer: Self-pay | Admitting: Family Medicine

## 2023-01-07 DIAGNOSIS — F5101 Primary insomnia: Secondary | ICD-10-CM

## 2023-01-23 ENCOUNTER — Encounter: Payer: Self-pay | Admitting: Nurse Practitioner

## 2023-01-23 ENCOUNTER — Ambulatory Visit: Payer: BC Managed Care – PPO | Admitting: Nurse Practitioner

## 2023-01-23 VITALS — BP 131/84 | HR 71 | Temp 98.1°F | Ht 65.0 in | Wt 198.0 lb

## 2023-01-23 DIAGNOSIS — E559 Vitamin D deficiency, unspecified: Secondary | ICD-10-CM | POA: Diagnosis not present

## 2023-01-23 DIAGNOSIS — Z6832 Body mass index (BMI) 32.0-32.9, adult: Secondary | ICD-10-CM

## 2023-01-23 DIAGNOSIS — E669 Obesity, unspecified: Secondary | ICD-10-CM | POA: Diagnosis not present

## 2023-01-23 DIAGNOSIS — R632 Polyphagia: Secondary | ICD-10-CM

## 2023-01-23 MED ORDER — TIRZEPATIDE-WEIGHT MANAGEMENT 2.5 MG/0.5ML ~~LOC~~ SOLN
2.5000 mg | SUBCUTANEOUS | 0 refills | Status: DC
Start: 2023-01-23 — End: 2023-02-10

## 2023-01-23 MED ORDER — VITAMIN D (ERGOCALCIFEROL) 1.25 MG (50000 UNIT) PO CAPS
50000.0000 [IU] | ORAL_CAPSULE | ORAL | 0 refills | Status: DC
Start: 2023-01-23 — End: 2023-08-15

## 2023-01-23 NOTE — Patient Instructions (Signed)

## 2023-01-23 NOTE — Progress Notes (Signed)
Office: [phone]  /  Fax: [phone]  WEIGHT SUMMARY AND BIOMETRICS  Weight Lost Since Last Visit: 2lb  Weight Gained Since Last Visit: 0lb   Vitals Temp: 98.1 F (36.7 C) BP: 131/84 Pulse Rate: 71 SpO2: 100 %   Anthropometric Measurements Height: 5\' 5"  (1.651 m) Weight: 198 lb (89.8 kg) BMI (Calculated): 32.95 Weight at Last Visit: 200lb Weight Lost Since Last Visit: 2lb Weight Gained Since Last Visit: 0lb Starting Weight: 213lb Total Weight Loss (lbs): 15 lb (6.804 kg)   Body Composition  Body Fat %: 40.8 % Fat Mass (lbs): 80.8 lbs Muscle Mass (lbs): 111.4 lbs Total Body Water (lbs): 79.4 lbs Visceral Fat Rating : 10   Other Clinical Data Fasting: No Labs: No Today's Visit #: 4 Starting Date: 10/18/22     HPI  Chief Complaint: OBESITY  Cartier is here to discuss her progress with her obesity treatment plan. She is on the the Category 2 Plan + 100 and states she is following her eating plan approximately 50 % of the time. She states she is exercising 30-45 minutes 3 days per week.  Started going to [employer].    Interval History:  Since last office visit she has lost 2 pounds.  She has had Covid, started a new job and stopped several meds since her last visit (estrogen and is now struggling with hot flashes). She is drinking sparkling water, water, coffee and ICE.  Struggling with polyphagia and cravings.   Notes worsening of constipation since increasing her protein intake.  She is taking fiber and colace OTC.  Last colonoscopy 2019.     Pharmacotherapy for weight loss: She is not currently taking medications  for medical weight loss.    Previous pharmacotherapy for medical weight loss:  Mounjaro (did well-stopped due to cost-didn't have side effects of constipation while taking it), Wegovy (stopped due to side effects of constipation and GERD).    Bariatric surgery:  Patient has not had bariatric surgery.       PHYSICAL  EXAM:  Blood pressure 131/84, pulse 71, temperature 98.1 F (36.7 C), height 5\' 5"  (1.651 m), weight 198 lb (89.8 kg), last menstrual period 10/30/2018, SpO2 100%. Body mass index is 32.95 kg/m.  General: She is overweight, cooperative, alert, well developed, and in no acute distress. PSYCH: Has normal mood, affect and thought process.   Extremities: No edema.  Neurologic: No gross sensory or motor deficits. No tremors or fasciculations noted.    DIAGNOSTIC DATA REVIEWED:  BMET    Component Value Date/Time   NA 139 11/17/2022 1039   K 4.2 11/17/2022 1039   CL 100 11/17/2022 1039   CO2 24 11/17/2022 1039   GLUCOSE 97 11/17/2022 1039   GLUCOSE 115 (H) 03/23/2022 1422   BUN 11 11/17/2022 1039   CREATININE 0.90 11/17/2022 1039   CALCIUM 10.0 11/17/2022 1039   GFRNONAA >60 03/23/2022 1422   Lab Results  Component Value Date   HGBA1C 5.3 11/17/2022   HGBA1C 5.5 01/14/2021   Lab Results  Component Value Date   INSULIN 25.3 (H) 10/18/2022   Lab Results  Component Value Date   TSH 1.800 11/17/2022   CBC    Component Value Date/Time   WBC 7.2 11/17/2022 1039   WBC 7.8 03/23/2022 1422   RBC 4.78 11/17/2022 1039   RBC 4.58 03/23/2022 1422   HGB 14.9 11/17/2022 1039   HCT 44.0 11/17/2022 1039   PLT 319 11/17/2022 1039   MCV 92 11/17/2022  1039   MCH 31.2 11/17/2022 1039   MCH 31.0 03/23/2022 1422   MCHC 33.9 11/17/2022 1039   MCHC 33.9 03/23/2022 1422   RDW 12.6 11/17/2022 1039   Iron Studies No results found for: "IRON", "TIBC", "FERRITIN", "IRONPCTSAT" Lipid Panel     Component Value Date/Time   CHOL 164 11/17/2022 1039   TRIG 159 (H) 11/17/2022 1039   HDL 41 11/17/2022 1039   CHOLHDL 4.0 11/17/2022 1039   LDLCALC 95 11/17/2022 1039   Hepatic Function Panel     Component Value Date/Time   PROT 7.4 11/17/2022 1039   ALBUMIN 4.8 11/17/2022 1039   AST 27 11/17/2022 1039   ALT 48 (H) 11/17/2022 1039   ALKPHOS 87 11/17/2022 1039   BILITOT 0.4 11/17/2022  1039      Component Value Date/Time   TSH 1.800 11/17/2022 1039   Nutritional Lab Results  Component Value Date   VD25OH 31.5 10/18/2022     ASSESSMENT AND PLAN  TREATMENT PLAN FOR OBESITY:  Recommended Dietary Goals  Arleth is currently in the action stage of change. As such, her goal is to continue weight management plan. She has agreed to the Category 2 Plan + 100 calories.    Behavioral Intervention  We discussed the following Behavioral Modification Strategies today: increasing lean protein intake, decreasing simple carbohydrates , increasing vegetables, increasing lower glycemic fruits, increasing water intake, work on meal planning and preparation, work on tracking and journaling calories using tracking application, reading food labels , continue to practice mindfulness when eating, and planning for success.  Additional resources provided today: NA  Recommended Physical Activity Goals  Lorijean has been advised to work up to 150 minutes of moderate intensity aerobic activity a week and strengthening exercises 2-3 times per week for cardiovascular health, weight loss maintenance and preservation of muscle mass.   She has agreed to Continue current level of physical activity , Think about ways to increase daily physical activity and overcoming barriers to exercise, and Increase physical activity in their day and reduce sedentary time (increase NEAT).   Pharmacotherapy We discussed various medication options to help Harriet with her weight loss efforts and we both agreed to start Zepbound 2.5mg .  Side effects discussed.  Patient has had a hysterectomy.    Contraindications: Pancreatitis (active gallstones) Medullary thyroid cancer High triglycerides (>500)-will need labs prior to starting Multiple Endocrine Neoplasia syndrome type 2 (MEN 2) Trying to get pregnant Breastfeeding Use with caution with taking insulin or sulfonylureas (will need to monitor blood sugars for  hypoglycemia)  ASSOCIATED CONDITIONS ADDRESSED TODAY  Action/Plan  Polyphagia -     Tirzepatide-Weight Management; Inject 2.5 mg into the skin once a week.  Dispense: 2 mL; Refill: 0  Vitamin D deficiency -     Vitamin D (Ergocalciferol); Take 1 capsule (50,000 Units total) by mouth every 7 (seven) days.  Dispense: 5 capsule; Refill: 0  Low Vitamin D level contributes to fatigue and are associated with obesity, breast, and colon cancer. She agrees to continue to take prescription Vitamin D @50 ,000 IU every week and will follow-up for routine testing of Vitamin D, at least 2-3 times per year to avoid over-replacement.   Generalized obesity -     Tirzepatide-Weight Management; Inject 2.5 mg into the skin once a week.  Dispense: 2 mL; Refill: 0  BMI 32.0-32.9,adult -     Tirzepatide-Weight Management; Inject 2.5 mg into the skin once a week.  Dispense: 2 mL; Refill: 0  Return in about 4 weeks (around 02/20/2023).Marland Kitchen She was informed of the importance of frequent follow up visits to maximize her success with intensive lifestyle modifications for her multiple health conditions.   ATTESTASTION STATEMENTS:  Reviewed by clinician on day of visit: allergies, medications, problem list, medical history, surgical history, family history, social history, and previous encounter notes.      Theodis Sato. Nami Strawder FNP-C

## 2023-01-24 ENCOUNTER — Telehealth: Payer: Self-pay

## 2023-01-24 NOTE — Telephone Encounter (Signed)
PA submitted through Cover My Meds for Zepbound. Awaiting insurance determination.  Key: [id number]

## 2023-01-25 NOTE — Telephone Encounter (Signed)
Per Cover My Meds: Your request has been denied Denied. This health benefit plan does not cover the following services, supplies, drugs or charges: Any treatment or regimen, medical or surgical, for the purpose of reducing or controlling the weight of the member, or for the treatment of obesity, except for surgical treatment of morbid obesity, or as specifically covered by this health benefit plan

## 2023-02-04 ENCOUNTER — Other Ambulatory Visit: Payer: Self-pay | Admitting: Family Medicine

## 2023-02-04 DIAGNOSIS — E782 Mixed hyperlipidemia: Secondary | ICD-10-CM

## 2023-02-05 ENCOUNTER — Other Ambulatory Visit: Payer: Self-pay | Admitting: Family Medicine

## 2023-02-05 DIAGNOSIS — E782 Mixed hyperlipidemia: Secondary | ICD-10-CM

## 2023-02-06 ENCOUNTER — Encounter: Payer: Self-pay | Admitting: Family Medicine

## 2023-02-06 NOTE — Telephone Encounter (Signed)
Pt scheduled for 05/04/2023 because that is pt next available day off and [facility] did not have no evening available apts.

## 2023-02-10 ENCOUNTER — Encounter: Payer: Self-pay | Admitting: Nurse Practitioner

## 2023-02-10 ENCOUNTER — Other Ambulatory Visit: Payer: Self-pay | Admitting: Family Medicine

## 2023-02-10 ENCOUNTER — Ambulatory Visit: Payer: BC Managed Care – PPO | Admitting: Family Medicine

## 2023-02-10 DIAGNOSIS — E782 Mixed hyperlipidemia: Secondary | ICD-10-CM

## 2023-02-10 MED ORDER — ATORVASTATIN CALCIUM 20 MG PO TABS: 20.0000 mg | ORAL_TABLET | Freq: Every day | ORAL | 3 refills | Status: DC

## 2023-02-27 ENCOUNTER — Ambulatory Visit: Payer: BC Managed Care – PPO | Admitting: Nurse Practitioner

## 2023-04-09 ENCOUNTER — Other Ambulatory Visit: Payer: Self-pay | Admitting: Medical Genetics

## 2023-05-04 ENCOUNTER — Ambulatory Visit: Payer: BC Managed Care – PPO | Admitting: Family Medicine

## 2023-05-19 ENCOUNTER — Other Ambulatory Visit: Payer: Self-pay | Admitting: Family Medicine

## 2023-05-19 DIAGNOSIS — Z1231 Encounter for screening mammogram for malignant neoplasm of breast: Secondary | ICD-10-CM

## 2023-05-24 ENCOUNTER — Ambulatory Visit
Admission: RE | Admit: 2023-05-24 | Discharge: 2023-05-24 | Disposition: A | Discharge status: Home / Self Care | Payer: BC Managed Care – PPO | Source: Ambulatory Visit | Attending: Family Medicine | Admitting: Family Medicine

## 2023-05-24 DIAGNOSIS — Z1231 Encounter for screening mammogram for malignant neoplasm of breast: Secondary | ICD-10-CM

## 2023-05-26 ENCOUNTER — Encounter: Payer: Self-pay | Admitting: Family Medicine

## 2023-06-12 IMAGING — XA DG FLUORO GUIDE NDL PLC/BX
2 series · 2 of 2 positions shown · non-contrast
Comparison: none

CLINICAL DATA: Frozen shoulder syndrome

[Series 1: ortho standard · 1 of 1 slices shown (1 of 2)]
[im 1/1]
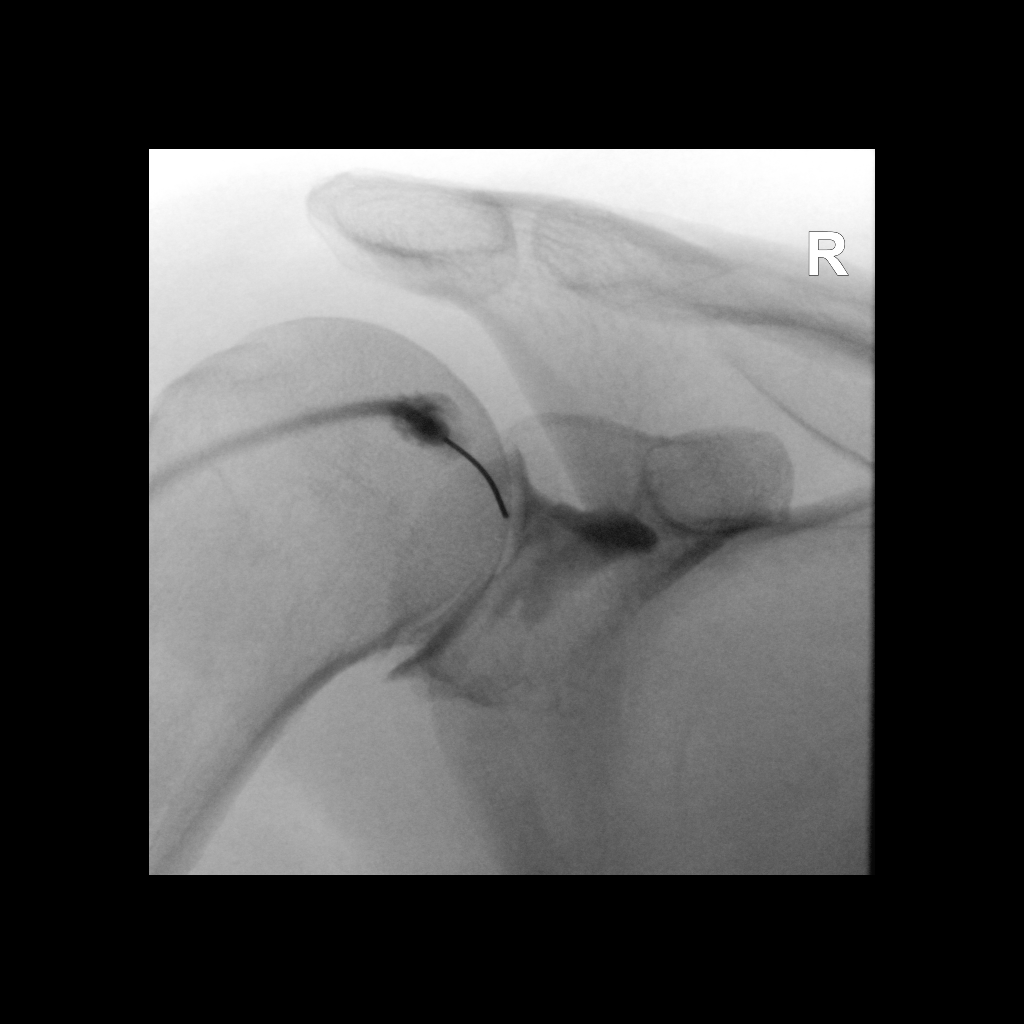

[Series 2: ortho standard · 1 of 1 slices shown (2 of 2)]
[im 1/1]
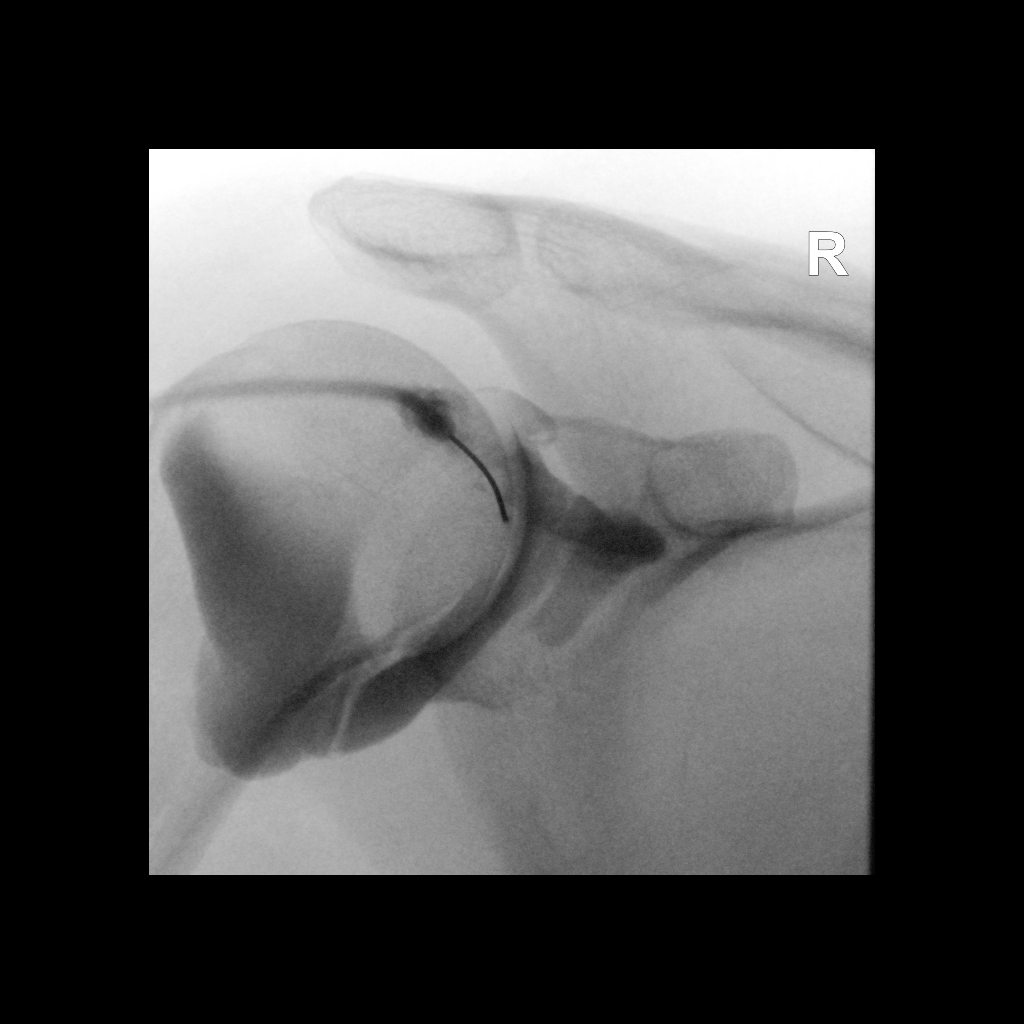

[2 of 2 positions shown; findings below may reference images not displayed]

FLUOROSCOPY:
0 minutes 11 seconds.  9.71 micro gray meter squared

PROCEDURE:
Right SHOULDER INJECTION UNDER FLUOROSCOPY

The skin overlying the shoulder was scrubbed with Betadine and
draped in sterile fashion. Skin and subcutaneous anesthesia was
carried out using a 25 gauge needle and 1% lidocaine. A 22 gauge
spinal needle was directed under fluoroscopic guidance on one pass
into the glenohumeral joint. 20 cc of a mixture of 0.1 cc MultiHance
and dilute Isovue 200 was then used to fill the glenohumeral joint.
Preliminary filming shows a grossly normal shoulder joint volume.
IMPRESSION: Technically successful right shoulder injection for MRI. The
shoulder joint accommodated the entire 20 cc injection and appeared
grossly normal at limited imaging.

## 2023-06-12 IMAGING — MR MR SHOULDER*R* W/CM
4 of 6 series · 13 of 40 positions shown · IV contrast (agent unspecified)
Comparison: Right shoulder radiographs 06/24/2021

CLINICAL DATA: Chronic pain. Evaluate for frozen shoulder.
Decreased range of motion. No prior surgery.

EXAM:
MRI OF THE RIGHT SHOULDER WITH CONTRAST
TECHNIQUE: Multiplanar, multisequence MR imaging of the right shoulder was
performed following the administration of intra-articular contrast.
CONTRAST:  See Injection Documentation.

[Series 7: T1 fat-sat · axial · right · 3.0mm · 0.36mm/px · z∈[-11,+58]mm · 3 of 28 slices shown (1 of 2)]
[im 6/28]
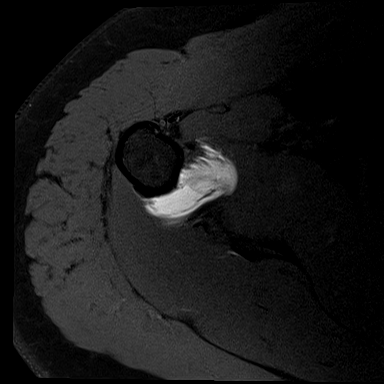
[im 17/28]
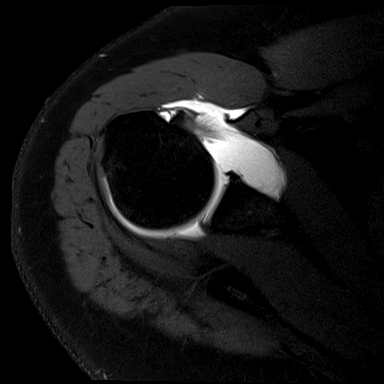
[im 28/28]
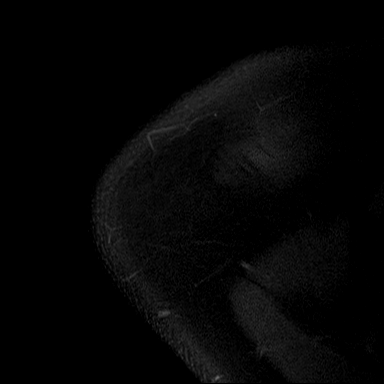

[Series 8: T2 fat-sat · oblique · right · 3.0mm · 0.22mm/px · 4 of 25 slices shown (1 of 2)]
[im 1/25]
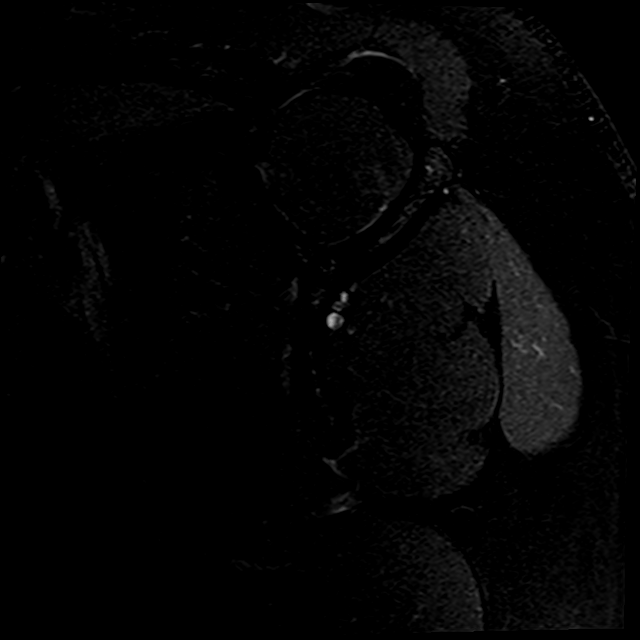
[im 5/25]
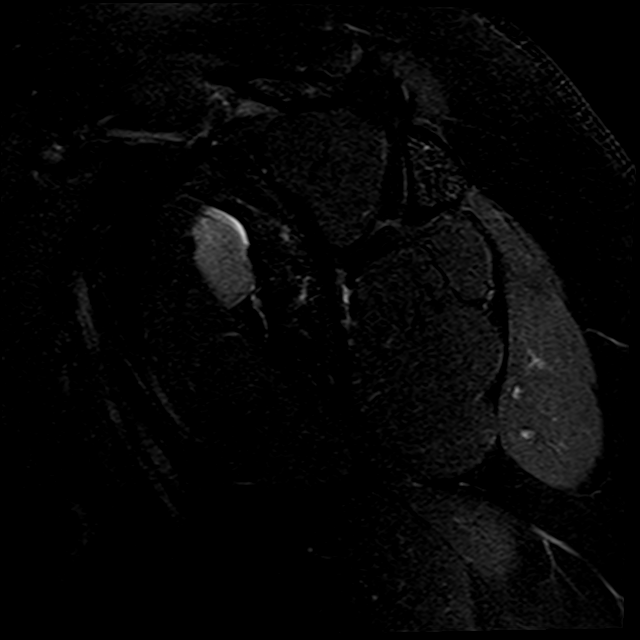
[im 13/25]
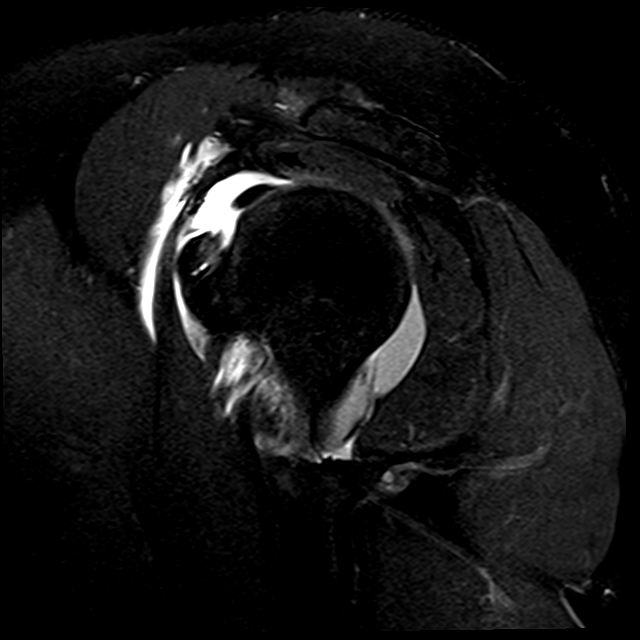
[im 21/25]
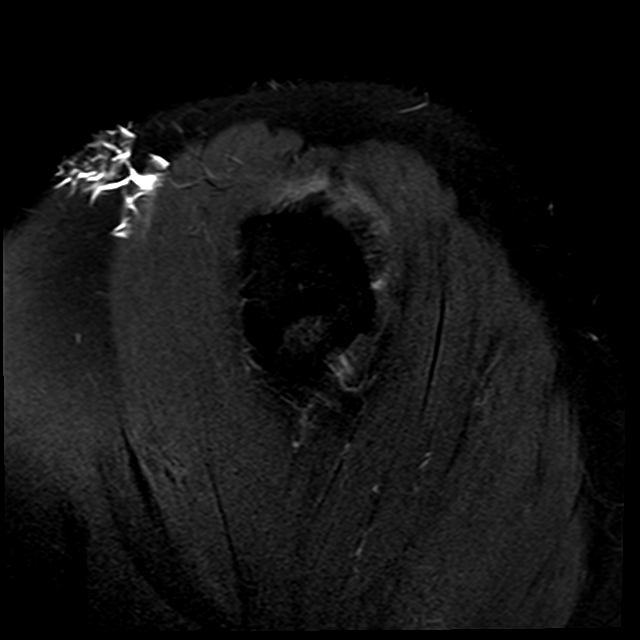

[Series 9: T1 fat-sat · oblique · right · 3.0mm · 0.18mm/px · 3 of 25 slices shown (2 of 2)]
[im 5/25]
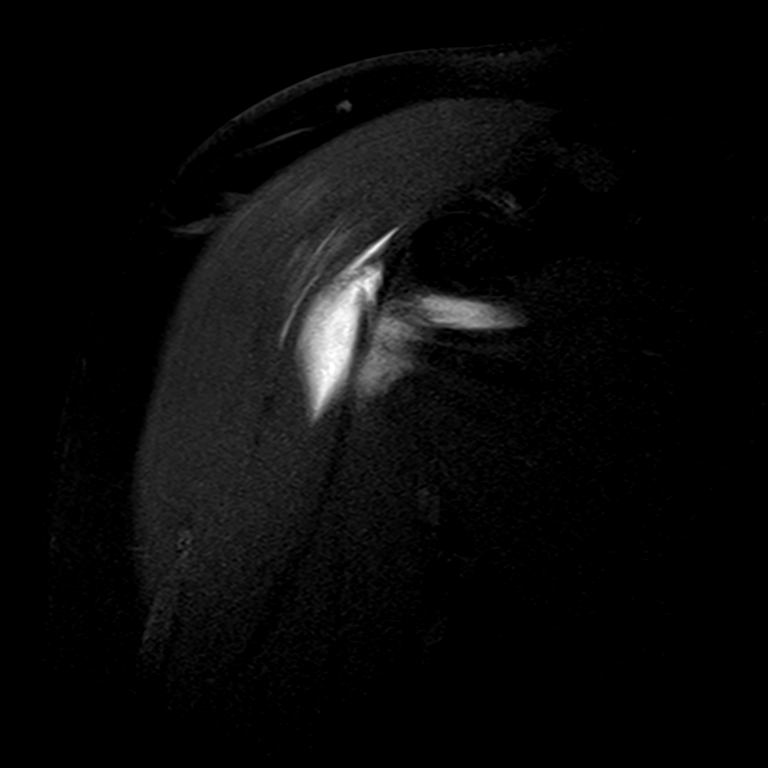
[im 13/25]
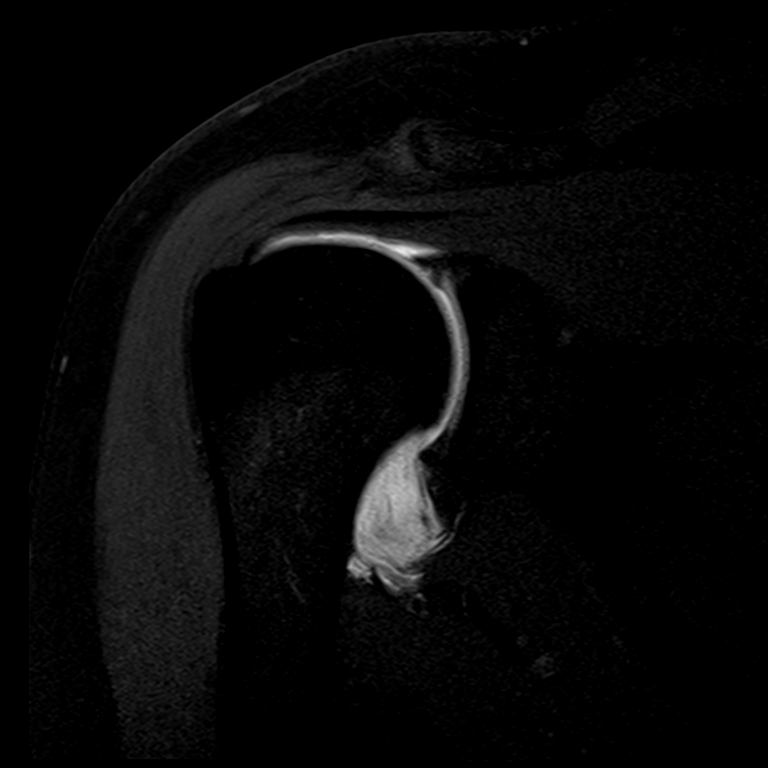
[im 21/25]
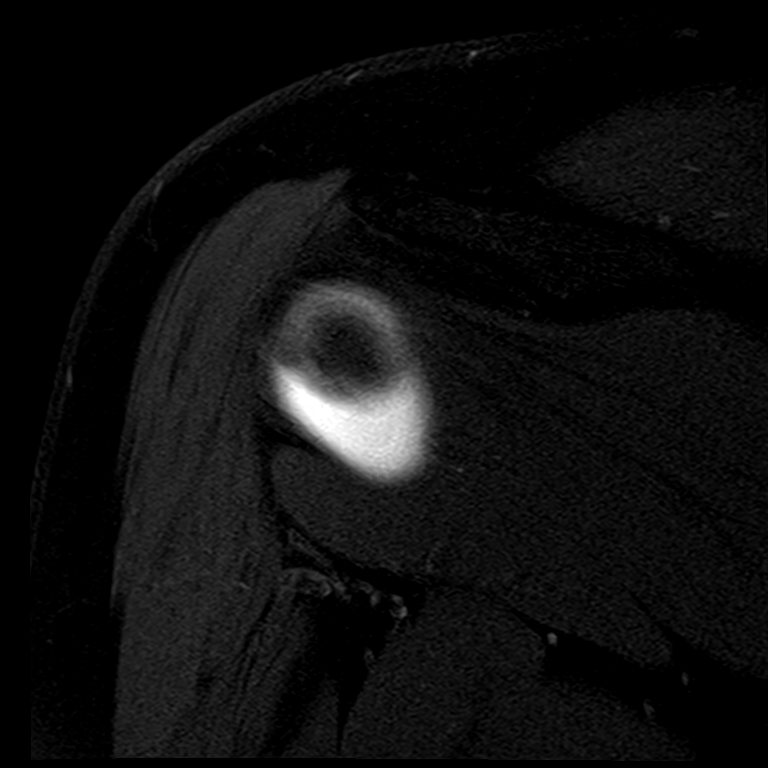

[Series 11: T2 fat-sat · oblique · right · 3.0mm · 0.22mm/px · 3 of 25 slices shown (2 of 2)]
[im 5/25]
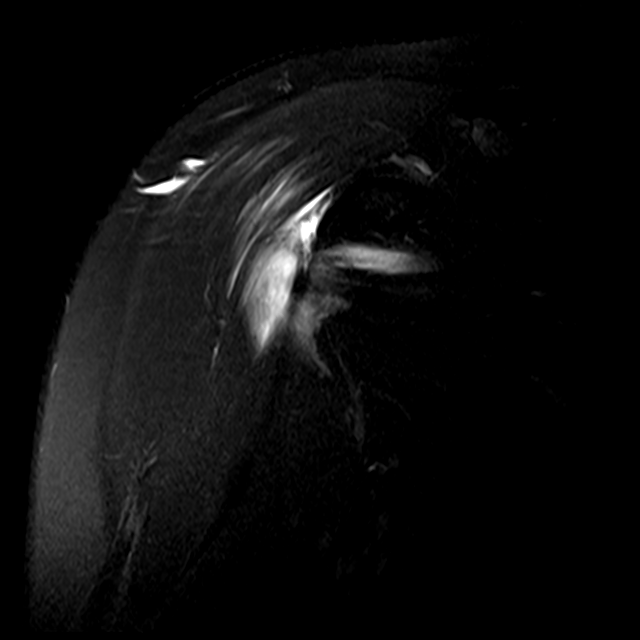
[im 13/25]
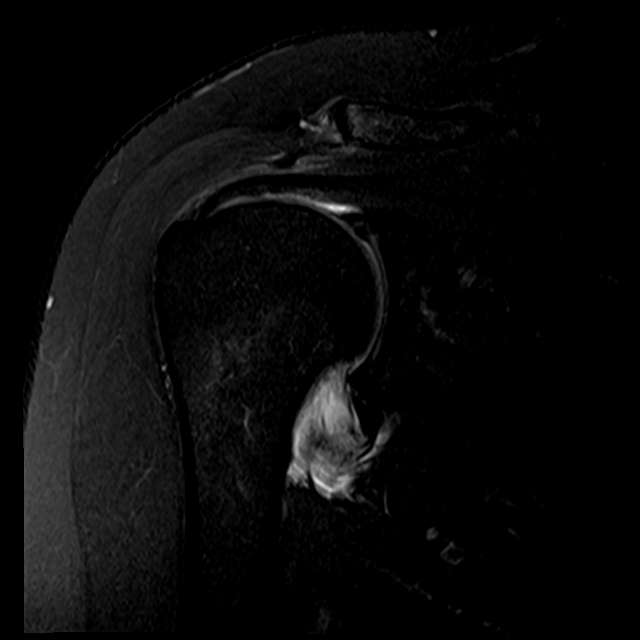
[im 21/25]
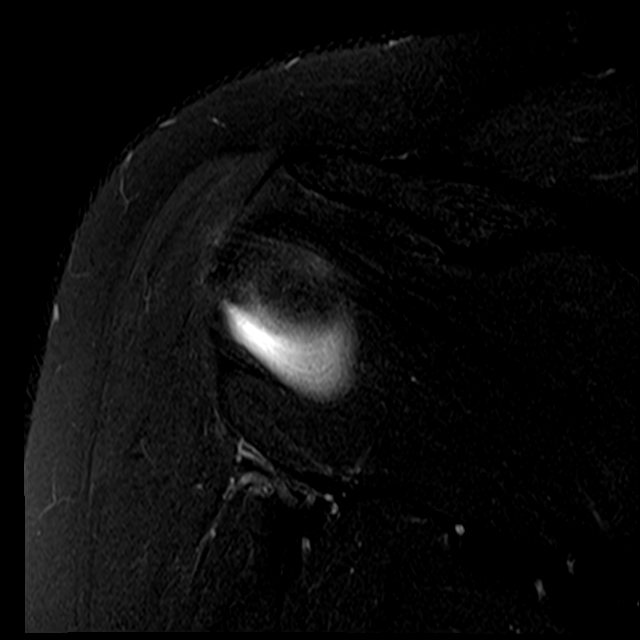

[13 of 40 positions shown; findings below may reference images not displayed]

FINDINGS: Rotator cuff: Mild intermediate T2 signal tendinosis with minimal
degenerative fraying of the bursal aspect of the anterior
infraspinatus tendon footprint (coronal series 11, image 10 and
sagittal series 8, image 21). The supraspinatus is intact. Mild
partial-thickness tearing of the articular side of the superior
subscapularis tendon insertion (sagittal images 14 and 15, axial
images 13 and 14). The teres minor is intact.

Muscles: No rotator cuff muscle atrophy, fatty infiltration, or
edema.

Biceps long head: There is mild thinning of the articular side of
the distal aspect of the horizontal portion of the proximal long
head of the biceps tendon (coronal series 11, image 16) just prior
to the tendon descends into the bicipital groove.

Acromioclavicular Joint: There are mild degenerative changes of the
acromioclavicular joint including joint space narrowing, subchondral
marrow edema, and peripheral osteophytosis. Type II acromion. No
subacromial/subdeltoid bursitis.

Glenohumeral Joint: Mild thinning of the inferior medial humeral
head cartilage. Mild thinning of the anterior inferior glenoid
cartilage with mild subchondral degenerative cystic change.

Labrum: Mild degenerative irregularity of the superficial aspect of
the anterior inferior glenoid labrum in the region of the greatest
cartilage degenerative change (axial series 7 images 6 and 7)
however otherwise no significant labral tear is seen.

Bones:  No acute fracture.

Other: None.
IMPRESSION: 1. Minimal tendinosis and degenerative fraying of the bursal aspect
of the anterior infraspinatus tendon footprint.
2. Mild partial-thickness tearing of the articular side of the
superior subscapularis tendon.
3. Probable mild focal partial-thickness tear of the distal aspect
of the horizontal portion of the proximal long head of the biceps
tendon.
4. Mild degenerative changes of the acromioclavicular joint.
5. Mild anterior inferior glenoid cartilage degenerative changes
with subchondral degenerative cystic change. Minimal peripheral
labral degenerative irregularity in this region, but otherwise no
significant labral tear.

## 2023-08-15 ENCOUNTER — Ambulatory Visit (INDEPENDENT_AMBULATORY_CARE_PROVIDER_SITE_OTHER): Admitting: Family Medicine

## 2023-08-15 VITALS — BP 127/84 | HR 83 | Temp 97.0°F | Ht 65.0 in | Wt 214.2 lb

## 2023-08-15 DIAGNOSIS — R6889 Other general symptoms and signs: Secondary | ICD-10-CM | POA: Diagnosis not present

## 2023-08-15 DIAGNOSIS — F411 Generalized anxiety disorder: Secondary | ICD-10-CM

## 2023-08-15 DIAGNOSIS — R0789 Other chest pain: Secondary | ICD-10-CM

## 2023-08-15 DIAGNOSIS — E782 Mixed hyperlipidemia: Secondary | ICD-10-CM

## 2023-08-15 DIAGNOSIS — E559 Vitamin D deficiency, unspecified: Secondary | ICD-10-CM

## 2023-08-15 DIAGNOSIS — R7401 Elevation of levels of liver transaminase levels: Secondary | ICD-10-CM

## 2023-08-15 DIAGNOSIS — R7309 Other abnormal glucose: Secondary | ICD-10-CM | POA: Diagnosis not present

## 2023-08-15 DIAGNOSIS — E8941 Symptomatic postprocedural ovarian failure: Secondary | ICD-10-CM

## 2023-08-15 DIAGNOSIS — Z6835 Body mass index (BMI) 35.0-35.9, adult: Secondary | ICD-10-CM | POA: Diagnosis not present

## 2023-08-15 DIAGNOSIS — L02412 Cutaneous abscess of left axilla: Secondary | ICD-10-CM

## 2023-08-15 LAB — LIPID PANEL

## 2023-08-15 MED ORDER — VENLAFAXINE HCL ER 37.5 MG PO CP24
37.5000 mg | ORAL_CAPSULE | Freq: Every day | ORAL | 1 refills | Status: DC
Start: 2023-08-15 — End: 2023-11-28

## 2023-08-15 MED ORDER — ATORVASTATIN CALCIUM 20 MG PO TABS: 20.0000 mg | ORAL_TABLET | Freq: Every day | ORAL | 1 refills | Status: DC

## 2023-08-15 MED ORDER — DOXYCYCLINE HYCLATE 100 MG PO TABS
100.0000 mg | ORAL_TABLET | Freq: Two times a day (BID) | ORAL | 0 refills | Status: AC
Start: 2023-08-15 — End: 2023-08-25

## 2023-08-15 NOTE — Progress Notes (Signed)
 Subjective:  Patient ID: Rebecca [name], female    DOB: [birth year], 53 y.o.   MRN: [id number]  Patient Care Team: Sonny Masters, FNP as PCP - General (Family Medicine)   Chief Complaint:  Medical Management of Chronic Issues, Mass (Under right arm that has been on and off x 6 months ago ), Hot Flashes, and Chest Pain (Chest heaviness x 3 day  )   HPI: Rebecca [name] is a 53 y.o. female presenting on 08/15/2023 for Medical Management of Chronic Issues, Mass (Under right arm that has been on and off x 6 months ago ), Hot Flashes, and Chest Pain (Chest heaviness x 3 day  )   Discussed the use of AI scribe software for clinical note transcription with the patient, who gave verbal consent to proceed.  History of Present Illness   Rebecca [name] is a 53 year old female who presents with a persistent knot on her arm and chest heaviness.  She has had a knot on her arm for approximately six months. The knot fluctuates in size, becoming larger and then smaller, but it never completely resolves. She has been applying Neosporin and attempting to pop it, but these measures have not been effective in eliminating it. The knot is currently small but persistent.  She experiences episodes of chest heaviness that have occurred sporadically but were present for two consecutive days recently. The heaviness is described as a tight feeling in the chest without associated pain or breathing difficulties. She can feel her heart beating during these episodes, but it does not feel irregular. The episodes last about 10-15 minutes and occur without any apparent triggers. No associated symptoms such as nausea, vomiting, jaw, neck, arm, or back pain, and there is no sweating or dizziness. She has a history of anxiety and sleep issues, which she believes may contribute to these symptoms.  She has been experiencing hot flashes since discontinuing EstroVal in July due to elevated hormone levels. She  previously used venlafaxine, which provided some relief, although not as effective as estradiol. She has not tried any other treatments for hot flashes since stopping estradiol.  She mentions a history of elevated liver enzymes, which she attributes to Lipitor use. Her liver enzymes were mildly elevated in the past, with the most recent level being 48, down from 66 previously. She has been off estradiol since November 23, 2022, and is curious if her liver enzymes have normalized since discontinuation.  She requests a refill for Lipitor, which she is currently taking, and mentions that her last glucose levels were not reviewed. She is also concerned about her weight, which has increased after discontinuing a weight loss program due to dissatisfaction with the care she received.         06/22/2022   10:57 AM 01/18/2022    4:13 PM 12/16/2021    8:47 AM 12/16/2021    8:45 AM 10/08/2021    8:11 AM  Depression screen PHQ 2/9  Decreased Interest 0 0 0 0 0  Down, Depressed, Hopeless 0 0 0 0 0  PHQ - 2 Score 0 0 0 0 0  Altered sleeping 0 2 2 0 0  Tired, decreased energy 0 0 2 0 1  Change in appetite 0 0 1 0 0  Feeling bad or failure about yourself  0 0 0 0 0  Trouble concentrating 0 0 0 0 0  Moving slowly or fidgety/restless 0 0 0 0 0  Suicidal thoughts 0 0 0  0  PHQ-9 Score 0 2 5 0 1  Difficult doing work/chores Not difficult at all Not difficult at all Not difficult at all Not difficult at all Not difficult at all      06/22/2022   10:58 AM 01/18/2022    4:14 PM 12/16/2021    8:45 AM 10/08/2021    8:11 AM  GAD 7 : Generalized Anxiety Score  Nervous, Anxious, on Edge 0 0 0 0  Control/stop worrying 0 0 0 0  Worry too much - different things 0 0 0 0  Trouble relaxing 0 0 0 0  Restless 0 0 0 0  Easily annoyed or irritable 0 0 0 0  Afraid - awful might happen 0 0 0 0  Total GAD 7 Score 0 0 0 0  Anxiety Difficulty Not difficult at all  Not difficult at all Not difficult at all        Relevant  past medical, surgical, family, and social history reviewed and updated as indicated.  Allergies and medications reviewed and updated. Data reviewed: Chart in Epic.   Past Medical History:  Diagnosis Date   Anxiety    Difficult intubation 11/09/2011   uneventful intubation using elective McGrath MAC 3 video laryngoscope 10/29/19 given prior history   Hyperlipidemia    Insomnia     Past Surgical History:  Procedure Laterality Date   ABDOMINAL HYSTERECTOMY  11/09/2011   hysterectomy with right ooporectomy   BREAST EXCISIONAL BIOPSY Left    OOPHORECTOMY Left 10/29/2019   right ooporectomy 11/09/11; left oophorectoy 10/29/19 for ovarian torsion   SHOULDER ARTHROSCOPY WITH SUBACROMIAL DECOMPRESSION AND BICEP TENDON REPAIR Right 04/19/2022   Procedure: RIGHT SHOULDER ARTHROSCOPY, SUBACROMIAL DECOMPRESSION,  BICEPS TENODESIS AND DEBRIDEMENT;  Surgeon: Cammy Copa, MD;  Location: [facility] OR;  Service: Orthopedics;  Laterality: Right;   TONSILLECTOMY Bilateral     Social History   Socioeconomic History   Marital status: Married    Spouse name: Not on file   Number of children: Not on file   Years of education: Not on file   Highest education level: Bachelor's degree (e.g., BA, AB, BS)  Occupational History   Occupation: [employer]  Tobacco Use   Smoking status: Never   Smokeless tobacco: Never  Vaping Use   Vaping status: Never Used  Substance and Sexual Activity   Alcohol use: Yes    Comment: 1-2 drinks per week   Drug use: Never   Sexual activity: Yes    Comment: total hysterectomy  Other Topics Concern   Not on file  Social History Narrative   Not on file   Social Drivers of Health   Financial Resource Strain: Low Risk  (08/15/2023)   Overall Financial Resource Strain (CARDIA)    Difficulty of Paying Living Expenses: Not hard at all  Food Insecurity: No Food Insecurity (08/15/2023)   Hunger Vital Sign    Worried About Running Out of Food in the Last  Year: Never true    Ran Out of Food in the Last Year: Never true  Transportation Needs: No Transportation Needs (08/15/2023)   PRAPARE - Administrator, Civil Service (Medical): No    Lack of Transportation (Non-Medical): No  Physical Activity: Insufficiently Active (08/15/2023)   Exercise Vital Sign    Days of Exercise per Week: 3 days    Minutes of Exercise per Session: 30 min  Stress: Stress Concern Present (08/15/2023)   [employer] of Occupational Health - Occupational Stress Questionnaire  Feeling of Stress : To some extent  Social Connections: Unknown (08/15/2023)   Social Connection and Isolation Panel [NHANES]    Frequency of Communication with Friends and Family: Twice a week    Frequency of Social Gatherings with Friends and Family: Patient declined    Attends Religious Services: Patient declined    Database administrator or Organizations: No    Attends Engineer, structural: Not on file    Marital Status: Married  Intimate Partner Violence: Not on file    Outpatient Encounter Medications as of 08/15/2023  Medication Sig   doxycycline (VIBRA-TABS) 100 MG tablet Take 1 tablet (100 mg total) by mouth 2 (two) times daily for 10 days. 1 po bid   fluticasone (FLONASE) 50 MCG/ACT nasal spray SPRAY 2 SPRAYS INTO EACH NOSTRIL EVERY DAY   venlafaxine XR (EFFEXOR XR) 37.5 MG 24 hr capsule Take 1 capsule (37.5 mg total) by mouth daily with breakfast.   [DISCONTINUED] atorvastatin (LIPITOR) 20 MG tablet Take 1 tablet (20 mg total) by mouth daily. **NEEDS TO BE SEEN BEFORE NEXT REFILL**   atorvastatin (LIPITOR) 20 MG tablet Take 1 tablet (20 mg total) by mouth daily.   [DISCONTINUED] atorvastatin (LIPITOR) 20 MG tablet Take 1 tablet (20 mg total) by mouth daily.   [DISCONTINUED] cetirizine (ZYRTEC) 10 MG tablet Take 1 tablet (10 mg total) by mouth daily.   [DISCONTINUED] Vitamin D, Ergocalciferol, (DRISDOL) 1.25 MG (50000 UNIT) CAPS capsule Take 1 capsule (50,000  Units total) by mouth every 7 (seven) days.   No facility-administered encounter medications on file as of 08/15/2023.    No Known Allergies  Pertinent ROS per HPI, otherwise unremarkable      Objective:  BP 127/84   Pulse 83   Temp (!) 97 F (36.1 C)   Ht 5\' 5"  (1.651 m)   Wt 214 lb 3.2 oz (97.2 kg)   LMP 10/30/2018 (Approximate) Comment: total hysterectmy  SpO2 97%   BMI 35.64 kg/m    Wt Readings from Last 3 Encounters:  08/15/23 214 lb 3.2 oz (97.2 kg)  01/23/23 198 lb (89.8 kg)  12/15/22 200 lb (90.7 kg)    Physical Exam Vitals and nursing note reviewed.  Constitutional:      General: She is not in acute distress.    Appearance: Normal appearance. She is well-developed and well-groomed. She is obese. She is not ill-appearing, toxic-appearing or diaphoretic.  HENT:     Head: Normocephalic and atraumatic.     Jaw: There is normal jaw occlusion.     Right Ear: Hearing normal.     Left Ear: Hearing normal.     Nose: Nose normal.     Mouth/Throat:     Lips: Pink.     Mouth: Mucous membranes are moist.     Pharynx: Oropharynx is clear. Uvula midline.  Eyes:     General: Lids are normal.     Extraocular Movements: Extraocular movements intact.     Conjunctiva/sclera: Conjunctivae normal.     Pupils: Pupils are equal, round, and reactive to light.  Neck:     Thyroid: No thyroid mass, thyromegaly or thyroid tenderness.     Vascular: No carotid bruit or JVD.     Trachea: Trachea and phonation normal.  Cardiovascular:     Rate and Rhythm: Normal rate and regular rhythm.     Chest Wall: PMI is not displaced.     Pulses: Normal pulses.     Heart sounds: Normal heart sounds. No  murmur heard.    No friction rub. No gallop.  Pulmonary:     Effort: Pulmonary effort is normal. No respiratory distress.     Breath sounds: Normal breath sounds. No wheezing.  Abdominal:     General: Bowel sounds are normal. There is no distension or abdominal bruit.     Palpations:  Abdomen is soft. There is no hepatomegaly or splenomegaly.     Tenderness: There is no abdominal tenderness. There is no right CVA tenderness or left CVA tenderness.     Hernia: No hernia is present.  Musculoskeletal:        General: Normal range of motion.     Cervical back: Normal range of motion and neck supple.     Right lower leg: No edema.     Left lower leg: No edema.  Lymphadenopathy:     Cervical: No cervical adenopathy.  Skin:    General: Skin is warm and dry.     Capillary Refill: Capillary refill takes less than 2 seconds.     Coloration: Skin is not cyanotic, jaundiced or pale.     Findings: Abscess (left axilla - scant purulent discharge) present. No rash.  Neurological:     General: No focal deficit present.     Mental Status: She is alert and oriented to person, place, and time.     Sensory: Sensation is intact.     Motor: Motor function is intact.     Coordination: Coordination is intact.     Gait: Gait is intact.     Deep Tendon Reflexes: Reflexes are normal and symmetric.  Psychiatric:        Attention and Perception: Attention and perception normal.        Mood and Affect: Mood and affect normal.        Speech: Speech normal.        Behavior: Behavior normal. Behavior is cooperative.        Thought Content: Thought content normal.        Cognition and Memory: Cognition and memory normal.        Judgment: Judgment normal.     EKG: SR 76, BBB, PR 154 ms, QT 392 ms, no acute ST-T changes. No significant changes from prior EKG. Kari Baars, FNP-C.   Results for orders placed or performed in visit on 11/17/22  Bayer DCA Hb A1c Waived   Collection Time: 11/17/22 10:37 AM  Result Value Ref Range   HB A1C (BAYER DCA - WAIVED) 5.3 4.8 - 5.6 %  Thyroid Panel With TSH   Collection Time: 11/17/22 10:39 AM  Result Value Ref Range   TSH 1.800 0.450 - 4.500 uIU/mL   T4, Total 7.1 4.5 - 12.0 ug/dL   T3 Uptake Ratio 26 24 - 39 %   Free Thyroxine Index 1.8 1.2 -  4.9  Hormone Panel   Collection Time: 11/17/22 10:39 AM  Result Value Ref Range   TSH 2.0 uU/mL   T4 6.5 ug/dL   Free T-3 2.9 pg/mL   Triiodothyronine (T-3), Serum 91 ng/dL   Testosterone, Serum (Total) 33 ng/dL   Free Testosterone, Serum 5.0 pg/mL   Testosterone-% Free 1.5 %   Follicle Stimulating Hormone 71 mIU/mL   Progesterone, Serum <10 ng/dL   DHEA-Sulfate, LCMS 161 ug/dL   Sex Hormone Binding Globulin 23.9 (L) nmol/L   Estrone Sulfate 551 (H) ng/dL   Estradiol, Serum, MS 23 pg/mL  CBC with Differential/Platelet   Collection Time: 11/17/22 10:39 AM  Result Value Ref Range   WBC 7.2 3.4 - 10.8 x10E3/uL   RBC 4.78 3.77 - 5.28 x10E6/uL   Hemoglobin 14.9 11.1 - 15.9 g/dL   Hematocrit 16.1 09.6 - 46.6 %   MCV 92 79 - 97 fL   MCH 31.2 26.6 - 33.0 pg   MCHC 33.9 31.5 - 35.7 g/dL   RDW 04.5 40.9 - 81.1 %   Platelets 319 150 - 450 x10E3/uL   Neutrophils 51 Not Estab. %   Lymphs 41 Not Estab. %   Monocytes 7 Not Estab. %   Eos 1 Not Estab. %   Basos 0 Not Estab. %   Neutrophils Absolute 3.7 1.4 - 7.0 x10E3/uL   Lymphocytes Absolute 2.9 0.7 - 3.1 x10E3/uL   Monocytes Absolute 0.5 0.1 - 0.9 x10E3/uL   EOS (ABSOLUTE) 0.1 0.0 - 0.4 x10E3/uL   Basophils Absolute 0.0 0.0 - 0.2 x10E3/uL   Immature Granulocytes 0 Not Estab. %   Immature Grans (Abs) 0.0 0.0 - 0.1 x10E3/uL  CMP14+EGFR   Collection Time: 11/17/22 10:39 AM  Result Value Ref Range   Glucose 97 70 - 99 mg/dL   BUN 11 6 - 24 mg/dL   Creatinine, Ser 9.14 0.57 - 1.00 mg/dL   eGFR 78 >78 GN/FAO/1.30   BUN/Creatinine Ratio 12 9 - 23   Sodium 139 134 - 144 mmol/L   Potassium 4.2 3.5 - 5.2 mmol/L   Chloride 100 96 - 106 mmol/L   CO2 24 20 - 29 mmol/L   Calcium 10.0 8.7 - 10.2 mg/dL   Total Protein 7.4 6.0 - 8.5 g/dL   Albumin 4.8 3.9 - 4.9 g/dL   Globulin, Total 2.6 1.5 - 4.5 g/dL   Bilirubin Total 0.4 0.0 - 1.2 mg/dL   Alkaline Phosphatase 87 44 - 121 IU/L   AST 27 0 - 40 IU/L   ALT 48 (H) 0 - 32 IU/L  Lipid  panel   Collection Time: 11/17/22 10:39 AM  Result Value Ref Range   Cholesterol, Total 164 100 - 199 mg/dL   Triglycerides 865 (H) 0 - 149 mg/dL   HDL 41 >78 mg/dL   VLDL Cholesterol Cal 28 5 - 40 mg/dL   LDL Chol Calc (NIH) 95 0 - 99 mg/dL   Chol/HDL Ratio 4.0 0.0 - 4.4 ratio       Pertinent labs & imaging results that were available during my care of the patient were reviewed by me and considered in my medical decision making.  Assessment & Plan:  Jayelyn was seen today for medical management of chronic issues, mass, hot flashes and chest pain.  Diagnoses and all orders for this visit:  Mixed hyperlipidemia -     Discontinue: atorvastatin (LIPITOR) 20 MG tablet; Take 1 tablet (20 mg total) by mouth daily. -     atorvastatin (LIPITOR) 20 MG tablet; Take 1 tablet (20 mg total) by mouth daily. -     CMP14+EGFR -     Lipid panel  Chest heaviness -     EKG 12-Lead -     CMP14+EGFR -     CBC with Differential/Platelet -     Lipid panel  GAD (generalized anxiety disorder) -     venlafaxine XR (EFFEXOR XR) 37.5 MG 24 hr capsule; Take 1 capsule (37.5 mg total) by mouth daily with breakfast. -     Hormone Panel  Vitamin D deficiency -     CMP14+EGFR -     VITAMIN D 25 Hydroxy (  Vit-D Deficiency, Fractures)  Elevated ALT measurement -     CMP14+EGFR  Hot flashes due to surgical menopause -     venlafaxine XR (EFFEXOR XR) 37.5 MG 24 hr capsule; Take 1 capsule (37.5 mg total) by mouth daily with breakfast. -     CMP14+EGFR -     Hormone Panel  BMI 35.0-35.9,adult -     CMP14+EGFR -     CBC with Differential/Platelet -     Hormone Panel -     VITAMIN D 25 Hydroxy (Vit-D Deficiency, Fractures) -     Lipid panel -     Bayer DCA Hb A1c Waived  Cutaneous abscess of left axilla -     doxycycline (VIBRA-TABS) 100 MG tablet; Take 1 tablet (100 mg total) by mouth 2 (two) times daily for 10 days. 1 po bid     Assessment and Plan    Recurrent axillary abscess She has a recurrent  axillary abscess present for six months, enlarging and draining intermittently without complete resolution with topical Neosporin. The abscess is currently small but persistent, necessitating further intervention. Oral antibiotics are recommended, with surgical excision considered if recurrence occurs post-treatment. - Prescribe oral doxycycline twice daily for ten days - Advise hot compresses and drainage - Consider surgical excision if recurrence occurs post-antibiotic treatment  Chest heaviness and palpitations She reports intermittent chest heaviness and palpitations lasting 10-15 minutes over two days, without pain, dyspnea, or alarming symptoms. EKG shows no acute changes. Symptoms may be anxiety-related; further evaluation is needed if persistent. Venlafaxine is considered for anxiety and hot flashes, potentially alleviating these symptoms. - Start venlafaxine (Effexor) at the lowest recommended dose - Monitor symptoms and report persistence of chest heaviness or palpitations - Consider stress test if symptoms persist despite medication  Hot flashes She experiences significant nocturnal hot flashes after discontinuing estradiol due to elevated levels and associated risks. Previously found relief with venlafaxine and is considering non-hormonal options. Black cohosh is recommended as an over-the-counter alternative. - Start venlafaxine (Effexor) at the lowest recommended dose - Consider black cohosh as an over-the-counter option  Hyperlipidemia She requires a Lipitor refill for hyperlipidemia management. Previous lab results were not reviewed; updated labs are needed to assess current status. - Refill Lipitor prescription - Order updated lipid panel  Elevated liver enzymes She has mildly elevated liver enzymes, potentially related to Lipitor and previously high estradiol levels. Current liver enzyme status needs reassessment. Allyne Gee is not an option due to potential liver effects. -  Order liver function tests  General Health Maintenance She inquired about necessary vaccinations and screenings at age 73. She has completed the shingles vaccine and is up to date on mammograms and colonoscopy. Pneumonia vaccine is not needed until age 45-60. HIV and Hepatitis C screenings were previously done in [place]. - No immediate vaccinations needed - Ensure all screenings are up to date  Follow-up She requires follow-up to assess the effectiveness of the new medication regimen and monitor persistent symptoms. - Reassess in 4 weeks to evaluate venlafaxine effectiveness and symptom resolution - Follow up in 3 months for comprehensive review and lab results          Continue all other maintenance medications.  Follow up plan: Return in about 3 months (around 11/14/2023), or if symptoms worsen or fail to improve, for chronic follow up.   Continue healthy lifestyle choices, including diet (rich in fruits, vegetables, and lean proteins, and low in salt and simple carbohydrates) and exercise (at least 30 minutes  of moderate physical activity daily).  Educational handout given for health maintenance   The above assessment and management plan was discussed with the patient. The patient verbalized understanding of and has agreed to the management plan. Patient is aware to call the clinic if they develop any new symptoms or if symptoms persist or worsen. Patient is aware when to return to the clinic for a follow-up visit. Patient educated on when it is appropriate to go to the emergency department.   Kattie Parrot, FNP-C [facility] [phone]

## 2023-08-16 LAB — BAYER DCA HB A1C WAIVED: HB A1C (BAYER DCA - WAIVED): 5.5 % (ref 4.8–5.6)

## 2023-08-20 NOTE — Addendum Note (Signed)
 Addended by: Galvin Jules on: 08/20/2023 10:56 AM   Modules accepted: Orders

## 2023-08-21 ENCOUNTER — Encounter: Payer: Self-pay | Admitting: Family Medicine

## 2023-08-21 ENCOUNTER — Ambulatory Visit: Payer: Self-pay

## 2023-08-21 NOTE — Telephone Encounter (Signed)
 Pt informed that is okay. LS

## 2023-08-21 NOTE — Telephone Encounter (Signed)
 After CMA gave pt lab results, pt called back in with questions. Pt asked if she should be taking Vitamin D  or Vitamin D3. Pt used to be prescribed Vitamin D , Ergocalciferol  by another doctor and wonders if this is what she should take. Vitamin D3 Cholecalciferol is what is available on [employer] to buy OTC. RN advised pt Vitamin D3 is most likely what she can take but RN did advise pt RN would relay question to her provider for more clarity.  Pt also asked if her liver US . RN confirmed US  is ordered and gave pt the name of imaging at [facility] so pt can schedule her US .  Copied from CRM [id number]. Topic: Clinical - Lab/Test Results >> Aug 21, 2023 10:39 AM Rennis Case wrote: Reason for CRM: Pt given results by office. Has further questions. Reason for Disposition  Health Information question, no triage required and triager able to answer question  Answer Assessment - Initial Assessment Questions 1. REASON FOR CALL or QUESTION: "What is your reason for calling today?" or "How can I best help you?" or "What question do you have that I can help answer?"     Pt had questions about which kind of Vitamin D  to take  Protocols used: Information Only Call - No Triage-A-AH

## 2023-08-22 ENCOUNTER — Ambulatory Visit: Payer: Self-pay | Admitting: Family Medicine

## 2023-08-22 ENCOUNTER — Ambulatory Visit (HOSPITAL_BASED_OUTPATIENT_CLINIC_OR_DEPARTMENT_OTHER)
Admission: RE | Admit: 2023-08-22 | Discharge: 2023-08-22 | Disposition: A | Discharge status: Home / Self Care | Source: Ambulatory Visit | Attending: Family Medicine | Admitting: Family Medicine

## 2023-08-22 DIAGNOSIS — K760 Fatty (change of) liver, not elsewhere classified: Secondary | ICD-10-CM | POA: Diagnosis not present

## 2023-08-22 DIAGNOSIS — R7401 Elevation of levels of liver transaminase levels: Secondary | ICD-10-CM | POA: Insufficient documentation

## 2023-08-22 DIAGNOSIS — R7989 Other specified abnormal findings of blood chemistry: Secondary | ICD-10-CM | POA: Diagnosis not present

## 2023-09-05 ENCOUNTER — Encounter: Payer: Self-pay | Admitting: Family Medicine

## 2023-09-05 LAB — CBC WITH DIFFERENTIAL/PLATELET
Basophils Absolute: 0 10*3/uL (ref 0.0–0.2)
Basos: 1 %
EOS (ABSOLUTE): 0.1 10*3/uL (ref 0.0–0.4)
Eos: 1 %
Hematocrit: 43.6 % (ref 34.0–46.6)
Hemoglobin: 14.4 g/dL (ref 11.1–15.9)
Immature Grans (Abs): 0 10*3/uL (ref 0.0–0.1)
Immature Granulocytes: 0 %
Lymphocytes Absolute: 3.6 10*3/uL — ABNORMAL HIGH (ref 0.7–3.1)
Lymphs: 43 %
MCH: 30.2 pg (ref 26.6–33.0)
MCHC: 33 g/dL (ref 31.5–35.7)
MCV: 91 fL (ref 79–97)
Monocytes Absolute: 0.6 10*3/uL (ref 0.1–0.9)
Monocytes: 7 %
Neutrophils Absolute: 4.1 10*3/uL (ref 1.4–7.0)
Neutrophils: 48 %
Platelets: 319 10*3/uL (ref 150–450)
RBC: 4.77 x10E6/uL (ref 3.77–5.28)
RDW: 13.1 % (ref 11.7–15.4)
WBC: 8.3 10*3/uL (ref 3.4–10.8)

## 2023-09-05 LAB — LIPID PANEL
Chol/HDL Ratio: 4.5 ratio — ABNORMAL HIGH (ref 0.0–4.4)
Cholesterol, Total: 193 mg/dL (ref 100–199)
HDL: 43 mg/dL (ref >39)
LDL Chol Calc (NIH): 114 mg/dL — ABNORMAL HIGH (ref 0–99)
Triglycerides: 207 mg/dL — ABNORMAL HIGH (ref 0–149)
VLDL Cholesterol Cal: 36 mg/dL (ref 5–40)

## 2023-09-05 LAB — HORMONE PANEL (T4,TSH,FSH,TESTT,SHBG,DHEA,ETC)
DHEA-Sulfate, LCMS: 83 ug/dL
Estradiol, Serum, MS: 8.7 pg/mL
Estrone Sulfate: 99 ng/dL
Follicle Stimulating Hormone: 65 m[IU]/mL
Free T-3: 3.3 pg/mL
Free Testosterone, Serum: 2.3 pg/mL
Sex Hormone Binding Globulin: 24.4 nmol/L
T4: 8.1 ug/dL
TSH: 2.4 uU/mL
Testosterone, Serum (Total): 13 ng/dL
Testosterone-% Free: 1.8 %
Triiodothyronine (T-3), Serum: 99 ng/dL

## 2023-09-05 LAB — CMP14+EGFR
ALT: 67 IU/L — ABNORMAL HIGH (ref 0–32)
AST: 29 IU/L (ref 0–40)
Albumin: 4.8 g/dL (ref 3.9–4.9)
Alkaline Phosphatase: 106 IU/L (ref 44–121)
BUN/Creatinine Ratio: 16 (ref 9–23)
BUN: 12 mg/dL (ref 6–24)
Bilirubin Total: 0.3 mg/dL (ref 0.0–1.2)
CO2: 25 mmol/L (ref 20–29)
Calcium: 9.6 mg/dL (ref 8.7–10.2)
Chloride: 101 mmol/L (ref 96–106)
Creatinine, Ser: 0.76 mg/dL (ref 0.57–1.00)
Globulin, Total: 2.6 g/dL (ref 1.5–4.5)
Glucose: 81 mg/dL (ref 70–99)
Potassium: 3.8 mmol/L (ref 3.5–5.2)
Sodium: 141 mmol/L (ref 134–144)
Total Protein: 7.4 g/dL (ref 6.0–8.5)
eGFR: 95 mL/min/{1.73_m2} (ref >59)

## 2023-09-05 LAB — VITAMIN D 25 HYDROXY (VIT D DEFICIENCY, FRACTURES): Vit D, 25-Hydroxy: 28.2 ng/mL — ABNORMAL LOW (ref 30.0–100.0)

## 2023-09-21 ENCOUNTER — Encounter: Payer: Self-pay | Admitting: Family Medicine

## 2023-09-28 DIAGNOSIS — M9903 Segmental and somatic dysfunction of lumbar region: Secondary | ICD-10-CM | POA: Diagnosis not present

## 2023-09-28 DIAGNOSIS — M62830 Muscle spasm of back: Secondary | ICD-10-CM | POA: Diagnosis not present

## 2023-09-28 DIAGNOSIS — M9901 Segmental and somatic dysfunction of cervical region: Secondary | ICD-10-CM | POA: Diagnosis not present

## 2023-09-28 DIAGNOSIS — M9902 Segmental and somatic dysfunction of thoracic region: Secondary | ICD-10-CM | POA: Diagnosis not present

## 2023-10-24 DIAGNOSIS — M7741 Metatarsalgia, right foot: Secondary | ICD-10-CM | POA: Diagnosis not present

## 2023-10-24 DIAGNOSIS — M79671 Pain in right foot: Secondary | ICD-10-CM | POA: Diagnosis not present

## 2023-11-09 ENCOUNTER — Ambulatory Visit: Admitting: Family Medicine

## 2023-11-16 DIAGNOSIS — M9903 Segmental and somatic dysfunction of lumbar region: Secondary | ICD-10-CM | POA: Diagnosis not present

## 2023-11-16 DIAGNOSIS — M9902 Segmental and somatic dysfunction of thoracic region: Secondary | ICD-10-CM | POA: Diagnosis not present

## 2023-11-16 DIAGNOSIS — M9901 Segmental and somatic dysfunction of cervical region: Secondary | ICD-10-CM | POA: Diagnosis not present

## 2023-11-16 DIAGNOSIS — M62830 Muscle spasm of back: Secondary | ICD-10-CM | POA: Diagnosis not present

## 2023-11-28 ENCOUNTER — Encounter: Payer: Self-pay | Admitting: Family Medicine

## 2023-11-28 ENCOUNTER — Ambulatory Visit (INDEPENDENT_AMBULATORY_CARE_PROVIDER_SITE_OTHER): Admitting: Family Medicine

## 2023-11-28 VITALS — BP 123/85 | HR 74 | Temp 97.5°F | Ht 65.0 in | Wt 205.4 lb

## 2023-11-28 DIAGNOSIS — E782 Mixed hyperlipidemia: Secondary | ICD-10-CM

## 2023-11-28 DIAGNOSIS — E559 Vitamin D deficiency, unspecified: Secondary | ICD-10-CM | POA: Diagnosis not present

## 2023-11-28 DIAGNOSIS — K760 Fatty (change of) liver, not elsewhere classified: Secondary | ICD-10-CM | POA: Diagnosis not present

## 2023-11-28 DIAGNOSIS — M79672 Pain in left foot: Secondary | ICD-10-CM | POA: Diagnosis not present

## 2023-11-28 DIAGNOSIS — M7742 Metatarsalgia, left foot: Secondary | ICD-10-CM | POA: Diagnosis not present

## 2023-11-28 DIAGNOSIS — Z808 Family history of malignant neoplasm of other organs or systems: Secondary | ICD-10-CM | POA: Insufficient documentation

## 2023-11-28 DIAGNOSIS — H66001 Acute suppurative otitis media without spontaneous rupture of ear drum, right ear: Secondary | ICD-10-CM

## 2023-11-28 DIAGNOSIS — L02412 Cutaneous abscess of left axilla: Secondary | ICD-10-CM

## 2023-11-28 MED ORDER — AMOXICILLIN-POT CLAVULANATE 875-125 MG PO TABS: 1.0000 | ORAL_TABLET | Freq: Two times a day (BID) | ORAL | 0 refills | Status: DC

## 2023-11-28 NOTE — Progress Notes (Signed)
 Subjective:  Patient ID: Rebecca [name], female    DOB: 1970-11-24, 53 y.o.   MRN: [id number]  Patient Care Team: Severa Rock HERO, FNP as PCP - General (Family Medicine)   Chief Complaint:  Referral (Derm) and fatty liver disease (Follow up- worked on diet x 2 months )   HPI: Rebecca [name] is a 53 y.o. female presenting on 11/28/2023 for Referral (Derm) and fatty liver disease (Follow up- worked on diet x 2 months )  Rebecca [name] is a 53 year old female with fatty liver disease who presents for follow-up on her liver ultrasound and labs.  Since her last visit in April, she has been actively managing her diet and exercise to address her fatty liver disease. She has reduced her calorie intake to 1,000 per day and walks five days a week for a hour on trails. Despite these efforts, she feels her weight loss is slow, having lost nine pounds since April. She follows a diet plan provided by a nutritionist, focusing on high protein intake, primarily from chicken, and has eliminated avocados, mayonnaise, and butter from her diet. She consumes eggs, mal[place] bacon, and mal[place] sausage. She does not consume alcohol and rarely takes Tylenol , except recently with Nyquil for sinus symptoms.  She has been experiencing sinus issues for the past five days, including sinus pain, pressure, and sweating, but no fever or cough. She tested negative for COVID-19 with tests from 2023. Her husband is also experiencing similar symptoms. She is using Sudafed for her sinus symptoms.  She has a persistent lump under her arm that has been treated with antibiotics and warm compresses, but it continues to drain and sometimes fills up like a blister.  There is a family history of melanoma, as her father recently had melanoma removed, and she is concerned about changes in her birthmark and other skin spots.  She is currently taking vitamin D3 and has been prescribed diclofenac for foot pain, which she  confirms is safe for her liver.        Relevant past medical, surgical, family, and social history reviewed and updated as indicated.  Allergies and medications reviewed and updated. Data reviewed: Chart in Epic.   Past Medical History:  Diagnosis Date   Anxiety    Difficult intubation 11/09/2011   uneventful intubation using elective McGrath MAC 3 video laryngoscope 10/29/19 given prior history   Hyperlipidemia    Insomnia     Past Surgical History:  Procedure Laterality Date   ABDOMINAL HYSTERECTOMY  11/09/2011   hysterectomy with right ooporectomy   BREAST EXCISIONAL BIOPSY Left    OOPHORECTOMY Left 10/29/2019   right ooporectomy 11/09/11; left oophorectoy 10/29/19 for ovarian torsion   SHOULDER ARTHROSCOPY WITH SUBACROMIAL DECOMPRESSION AND BICEP TENDON REPAIR Right 04/19/2022   Procedure: RIGHT SHOULDER ARTHROSCOPY, SUBACROMIAL DECOMPRESSION,  BICEPS TENODESIS AND DEBRIDEMENT;  Surgeon: Addie Cordella Hamilton, MD;  Location: [facility] OR;  Service: Orthopedics;  Laterality: Right;   TONSILLECTOMY Bilateral     Social History   Socioeconomic History   Marital status: Married    Spouse name: Not on file   Number of children: Not on file   Years of education: Not on file   Highest education level: Bachelor's degree (e.g., BA, AB, BS)  Occupational History   Occupation: [employer]  Tobacco Use   Smoking status: Never   Smokeless tobacco: Never  Vaping Use   Vaping status: Never Used  Substance and Sexual Activity   Alcohol use: Yes  Comment: 1-2 drinks per week   Drug use: Never   Sexual activity: Yes    Comment: total hysterectomy  Other Topics Concern   Not on file  Social History Narrative   Not on file   Social Drivers of Health   Financial Resource Strain: Low Risk  (11/21/2023)   Overall Financial Resource Strain (CARDIA)    Difficulty of Paying Living Expenses: Not hard at all  Food Insecurity: No Food Insecurity (11/21/2023)   Hunger Vital Sign     Worried About Running Out of Food in the Last Year: Never true    Ran Out of Food in the Last Year: Never true  Transportation Needs: No Transportation Needs (11/21/2023)   PRAPARE - Administrator, Civil Service (Medical): No    Lack of Transportation (Non-Medical): No  Physical Activity: Sufficiently Active (11/21/2023)   Exercise Vital Sign    Days of Exercise per Week: 5 days    Minutes of Exercise per Session: 60 min  Stress: Stress Concern Present (11/21/2023)   [employer] of Occupational Health - Occupational Stress Questionnaire    Feeling of Stress: To some extent  Social Connections: Moderately Isolated (11/21/2023)   Social Connection and Isolation Panel    Frequency of Communication with Friends and Family: Three times a week    Frequency of Social Gatherings with Friends and Family: Patient declined    Attends Religious Services: Patient declined    Active Member of Clubs or Organizations: No    Attends Banker Meetings: Not on file    Marital Status: Married  Intimate Partner Violence: Not on file    Outpatient Encounter Medications as of 11/28/2023  Medication Sig   amoxicillin -clavulanate (AUGMENTIN ) 875-125 MG tablet Take 1 tablet by mouth 2 (two) times daily.   atorvastatin  (LIPITOR) 20 MG tablet Take 1 tablet (20 mg total) by mouth daily.   diclofenac (VOLTAREN) 75 MG EC tablet Take 75 mg by mouth 2 (two) times daily.   fluticasone  (FLONASE ) 50 MCG/ACT nasal spray SPRAY 2 SPRAYS INTO EACH NOSTRIL EVERY DAY   [DISCONTINUED] venlafaxine  XR (EFFEXOR  XR) 37.5 MG 24 hr capsule Take 1 capsule (37.5 mg total) by mouth daily with breakfast.   No facility-administered encounter medications on file as of 11/28/2023.    No Known Allergies  Pertinent ROS per HPI, otherwise unremarkable      Objective:  BP 123/85   Pulse 74   Temp (!) 97.5 F (36.4 C)   Ht 5' 5 (1.651 m)   Wt 205 lb 6.4 oz (93.2 kg)   LMP 10/30/2018 (Approximate)  Comment: total hysterectmy  SpO2 99%   BMI 34.18 kg/m    Wt Readings from Last 3 Encounters:  11/28/23 205 lb 6.4 oz (93.2 kg)  08/15/23 214 lb 3.2 oz (97.2 kg)  01/23/23 198 lb (89.8 kg)    Physical Exam Vitals and nursing note reviewed.  Constitutional:      General: She is not in acute distress.    Appearance: Normal appearance. She is obese. She is not ill-appearing, toxic-appearing or diaphoretic.  HENT:     Head: Normocephalic and atraumatic.     Right Ear: Tympanic membrane is erythematous and bulging. Tympanic membrane is not perforated.     Left Ear: Hearing, tympanic membrane, ear canal and external ear normal.     Nose: Congestion present.     Right Sinus: Maxillary sinus tenderness and frontal sinus tenderness present.     Left Sinus: Maxillary  sinus tenderness and frontal sinus tenderness present.     Mouth/Throat:     Lips: Pink.     Mouth: Mucous membranes are moist.     Pharynx: Oropharynx is clear. No oropharyngeal exudate or posterior oropharyngeal erythema.  Eyes:     Conjunctiva/sclera: Conjunctivae normal.     Pupils: Pupils are equal, round, and reactive to light.  Cardiovascular:     Rate and Rhythm: Normal rate and regular rhythm.     Heart sounds: Normal heart sounds.  Pulmonary:     Effort: Pulmonary effort is normal.     Breath sounds: Normal breath sounds.  Musculoskeletal:     Cervical back: Neck supple.     Right lower leg: No edema.     Left lower leg: No edema.  Skin:    General: Skin is warm and dry.     Capillary Refill: Capillary refill takes less than 2 seconds.      Neurological:     General: No focal deficit present.     Mental Status: She is alert and oriented to person, place, and time.  Psychiatric:        Mood and Affect: Mood normal.        Behavior: Behavior normal. Behavior is cooperative.        Thought Content: Thought content normal.        Judgment: Judgment normal.      Results for orders placed or performed in  visit on 08/15/23  Bayer DCA Hb A1c Waived   Collection Time: 08/15/23  4:16 PM  Result Value Ref Range   HB A1C (BAYER DCA - WAIVED) 5.5 4.8 - 5.6 %  CMP14+EGFR   Collection Time: 08/15/23  4:18 PM  Result Value Ref Range   Glucose 81 70 - 99 mg/dL   BUN 12 6 - 24 mg/dL   Creatinine, Ser 9.23 0.57 - 1.00 mg/dL   eGFR 95 >40 fO/fpw/8.26   BUN/Creatinine Ratio 16 9 - 23   Sodium 141 134 - 144 mmol/L   Potassium 3.8 3.5 - 5.2 mmol/L   Chloride 101 96 - 106 mmol/L   CO2 25 20 - 29 mmol/L   Calcium  9.6 8.7 - 10.2 mg/dL   Total Protein 7.4 6.0 - 8.5 g/dL   Albumin 4.8 3.9 - 4.9 g/dL   Globulin, Total 2.6 1.5 - 4.5 g/dL   Bilirubin Total 0.3 0.0 - 1.2 mg/dL   Alkaline Phosphatase 106 44 - 121 IU/L   AST 29 0 - 40 IU/L   ALT 67 (H) 0 - 32 IU/L  CBC with Differential/Platelet   Collection Time: 08/15/23  4:18 PM  Result Value Ref Range   WBC 8.3 3.4 - 10.8 x10E3/uL   RBC 4.77 3.77 - 5.28 x10E6/uL   Hemoglobin 14.4 11.1 - 15.9 g/dL   Hematocrit 56.3 65.9 - 46.6 %   MCV 91 79 - 97 fL   MCH 30.2 26.6 - 33.0 pg   MCHC 33.0 31.5 - 35.7 g/dL   RDW 86.8 88.2 - 84.5 %   Platelets 319 150 - 450 x10E3/uL   Neutrophils 48 Not Estab. %   Lymphs 43 Not Estab. %   Monocytes 7 Not Estab. %   Eos 1 Not Estab. %   Basos 1 Not Estab. %   Neutrophils Absolute 4.1 1.4 - 7.0 x10E3/uL   Lymphocytes Absolute 3.6 (H) 0.7 - 3.1 x10E3/uL   Monocytes Absolute 0.6 0.1 - 0.9 x10E3/uL   EOS (ABSOLUTE) 0.1 0.0 -  0.4 x10E3/uL   Basophils Absolute 0.0 0.0 - 0.2 x10E3/uL   Immature Granulocytes 0 Not Estab. %   Immature Grans (Abs) 0.0 0.0 - 0.1 x10E3/uL  Hormone Panel   Collection Time: 08/15/23  4:18 PM  Result Value Ref Range   TSH 2.4 uU/mL   T4 8.1 ug/dL   Free T-3 3.3 pg/mL   Triiodothyronine (T-3), Serum 99 ng/dL   Testosterone , Serum (Total) 13 ng/dL   Free Testosterone , Serum 2.3 pg/mL   Testosterone -% Free 1.8 %   Follicle Stimulating Hormone 65 mIU/mL   Progesterone , Serum <10 ng/dL    DHEA-Sulfate, LCMS 83 ug/dL   Sex Hormone Binding Globulin 24.4 nmol/L   Estrone  Sulfate 99 ng/dL   Estradiol , Serum, MS 8.7 pg/mL  VITAMIN D  25 Hydroxy (Vit-D Deficiency, Fractures)   Collection Time: 08/15/23  4:18 PM  Result Value Ref Range   Vit D, 25-Hydroxy 28.2 (L) 30.0 - 100.0 ng/mL  Lipid panel   Collection Time: 08/15/23  4:18 PM  Result Value Ref Range   Cholesterol, Total 193 100 - 199 mg/dL   Triglycerides 792 (H) 0 - 149 mg/dL   HDL 43 >60 mg/dL   VLDL Cholesterol Cal 36 5 - 40 mg/dL   LDL Chol Calc (NIH) 885 (H) 0 - 99 mg/dL   Chol/HDL Ratio 4.5 (H) 0.0 - 4.4 ratio       Pertinent labs & imaging results that were available during my care of the patient were reviewed by me and considered in my medical decision making.  Assessment & Plan:  Curtina was seen today for referral and fatty liver disease.  Diagnoses and all orders for this visit:  Vitamin D  deficiency -     CMP14+EGFR -     Vitamin D , 25-hydroxy  Mixed hyperlipidemia -     CMP14+EGFR -     Lipid panel  Hepatic steatosis -     CMP14+EGFR -     Lipid panel  Cutaneous abscess of left axilla -     Ambulatory referral to Dermatology  Family history of melanoma -     Ambulatory referral to Dermatology  Non-recurrent acute suppurative otitis media of right ear without spontaneous rupture of tympanic membrane -     amoxicillin -clavulanate (AUGMENTIN ) 875-125 MG tablet; Take 1 tablet by mouth 2 (two) times daily.     Right otitis media Symptoms consistent with acute sinusitis, including sinus pressure, facial pain, and ear fullness for the past five days. Right ear full of pus, indicating an ear infection. Symptoms include sinus pressure, facial discomfort, and sweating, likely related to the ear infection. - Prescribe Augmentin  for right ear infection - Instruct to take Augmentin  with food and plenty of water - Prescribe Flonase  to help with sinus drainage - Advise to continue using Sudafed for sinus  congestion  Acute sinusitis Experiencing sinus symptoms for the past five days, including sinus pain, pressure, and hot and cold sweats. Initially suspected COVID-19 but tested negative with at-home tests from 2023. COVID-19 is circulating again, but symptoms are consistent with acute sinusitis, likely viral in nature. Using Nyquil and Sudafed for symptom relief. - Prescribe Augmentin  to treat sinusitis - Instruct to take Augmentin  with food and plenty of water - Prescribe Flonase  to help with sinus drainage - Advise to continue using Sudafed for symptom relief  Fatty liver disease Diagnosed following a liver ultrasound in April. Actively managing condition by reducing calorie intake to 1000 calories per day and engaging in trail walking for  an hour, five days a week. Lost nine pounds since April, though weight loss is perceived as slow. Discussed potential use of supplements like milk thistle, beetroot, and liver cleanse products, but noted their efficacy is not FDA-approved. Does not consume alcohol and is cautious with medications affecting liver health. Liver ultrasound should not be repeated for at least eight months unless liver enzymes increase further. Potential future availability of insurance coverage for GLP-1 receptor agonists, which have shown promise in the MASH study for liver health. - Order repeat liver function tests to assess liver enzyme levels - Provide information on liver health supplements such as milk thistle, beetroot, and liver cleanse products - Monitor for insurance coverage updates on GLP-1 receptor agonists for potential future treatment  Obesity Actively working on weight loss since June, reducing calorie intake to 1000 calories per day and engaging in regular physical activity by walking on trails for an hour, five days a week. Lost nine pounds since April. Acknowledged slow progress but encouraged continuation of current regimen. Following a nutrition plan provided  by a nutritionist and consuming a high-protein diet with limited unhealthy fats. Discussed potential future availability of insurance coverage for GLP-1 receptor agonists, which have shown promise in aiding weight loss, as indicated by the MASH study. 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 Cyst occasionally fills up like a blister and  drains. Discussed possibility of referral to a dermatologist for further evaluation and potential excision or daily preventive treatment. - Refer to dermatologist for evaluation and potential excision of axillary cyst  Birthmark with recent change Reports a birthmark that has changed in size and appearance, becoming larger and more painful. Family history of melanoma, as father recently had melanoma removed. - Refer to dermatologist for evaluation of birthmark and other skin spots  General Health Maintenance Inquired about RSV and pneumonia vaccinations. Not considered high risk for RSV and does not require the RSV vaccine. Previously received the Pneumovax 23 and Prevnar 13 vaccines, providing adequate protection until age 29. Confirmed that the new combination pneumonia vaccine is not needed at this time. Taking vitamin D3 supplements and will have vitamin D  levels checked. - Advise that RSV vaccine is not necessary unless at high risk - Confirm up to date with pneumonia vaccines until age 85          Continue all other maintenance medications.  Follow up plan: Return in about 6 months (around 05/30/2024), or if symptoms worsen or fail to improve, for Annual Physical.   Continue healthy lifestyle choices, including diet (rich in fruits, vegetables, and lean proteins, and low in salt and simple carbohydrates) and exercise (at least 30 minutes of  moderate physical activity daily).  Educational handout given for Fatty liver disease   The above assessment and management plan was discussed with the patient. The patient verbalized understanding of and has agreed to the management plan. Patient is aware to call the clinic if they develop any new symptoms or if symptoms persist or worsen. Patient is aware when to return to the clinic for a follow-up visit. Patient educated on when it is appropriate to go to the emergency department.   Rosaline Bruns, FNP-C [facility] [phone]

## 2023-11-29 ENCOUNTER — Encounter: Payer: Self-pay | Admitting: Physician Assistant

## 2023-11-29 ENCOUNTER — Encounter: Payer: Self-pay | Admitting: Family Medicine

## 2023-11-29 ENCOUNTER — Ambulatory Visit: Payer: Self-pay | Admitting: Family Medicine

## 2023-11-29 DIAGNOSIS — K760 Fatty (change of) liver, not elsewhere classified: Secondary | ICD-10-CM

## 2023-11-29 DIAGNOSIS — R7401 Elevation of levels of liver transaminase levels: Secondary | ICD-10-CM

## 2023-11-29 LAB — CMP14+EGFR
ALT: 70 IU/L — ABNORMAL HIGH (ref 0–32)
AST: 30 IU/L (ref 0–40)
Albumin: 4.5 g/dL (ref 3.9–4.9)
Alkaline Phosphatase: 103 IU/L (ref 44–121)
BUN/Creatinine Ratio: 14 (ref 9–23)
BUN: 12 mg/dL (ref 6–24)
Bilirubin Total: 0.3 mg/dL (ref 0.0–1.2)
CO2: 22 mmol/L (ref 20–29)
Calcium: 10 mg/dL (ref 8.7–10.2)
Chloride: 101 mmol/L (ref 96–106)
Creatinine, Ser: 0.84 mg/dL (ref 0.57–1.00)
Globulin, Total: 2.8 g/dL (ref 1.5–4.5)
Glucose: 87 mg/dL (ref 70–99)
Potassium: 4.2 mmol/L (ref 3.5–5.2)
Sodium: 139 mmol/L (ref 134–144)
Total Protein: 7.3 g/dL (ref 6.0–8.5)
eGFR: 85 mL/min/1.73 (ref >59)

## 2023-11-29 LAB — VITAMIN D 25 HYDROXY (VIT D DEFICIENCY, FRACTURES): Vit D, 25-Hydroxy: 38.7 ng/mL (ref 30.0–100.0)

## 2023-11-29 LAB — LIPID PANEL
Chol/HDL Ratio: 5.1 ratio — ABNORMAL HIGH (ref 0.0–4.4)
Cholesterol, Total: 209 mg/dL — ABNORMAL HIGH (ref 100–199)
HDL: 41 mg/dL (ref >39)
LDL Chol Calc (NIH): 138 mg/dL — ABNORMAL HIGH (ref 0–99)
Triglycerides: 165 mg/dL — ABNORMAL HIGH (ref 0–149)
VLDL Cholesterol Cal: 30 mg/dL (ref 5–40)

## 2024-01-09 DIAGNOSIS — H5213 Myopia, bilateral: Secondary | ICD-10-CM | POA: Diagnosis not present

## 2024-01-09 DIAGNOSIS — H40013 Open angle with borderline findings, low risk, bilateral: Secondary | ICD-10-CM | POA: Diagnosis not present

## 2024-01-25 ENCOUNTER — Other Ambulatory Visit (INDEPENDENT_AMBULATORY_CARE_PROVIDER_SITE_OTHER)

## 2024-01-25 ENCOUNTER — Ambulatory Visit: Admitting: Physician Assistant

## 2024-01-25 ENCOUNTER — Encounter: Payer: Self-pay | Admitting: Physician Assistant

## 2024-01-25 VITALS — BP 124/86 | HR 72 | Ht 65.0 in | Wt 213.4 lb

## 2024-01-25 DIAGNOSIS — K760 Fatty (change of) liver, not elsewhere classified: Secondary | ICD-10-CM | POA: Diagnosis not present

## 2024-01-25 DIAGNOSIS — R7401 Elevation of levels of liver transaminase levels: Secondary | ICD-10-CM

## 2024-01-25 LAB — COMPREHENSIVE METABOLIC PANEL WITH GFR
ALT: 73 U/L — ABNORMAL HIGH (ref 0–35)
AST: 30 U/L (ref 0–37)
Albumin: 4.7 g/dL (ref 3.5–5.2)
Alkaline Phosphatase: 90 U/L (ref 39–117)
BUN: 10 mg/dL (ref 6–23)
CO2: 30 meq/L (ref 19–32)
Calcium: 9.6 mg/dL (ref 8.4–10.5)
Chloride: 102 meq/L (ref 96–112)
Creatinine, Ser: 0.71 mg/dL (ref 0.40–1.20)
GFR: 98.74 mL/min (ref >60.00)
Glucose, Bld: 96 mg/dL (ref 70–99)
Potassium: 3.6 meq/L (ref 3.5–5.1)
Sodium: 140 meq/L (ref 135–145)
Total Bilirubin: 0.4 mg/dL (ref 0.2–1.2)
Total Protein: 7.6 g/dL (ref 6.0–8.3)

## 2024-01-25 LAB — IBC + FERRITIN
Ferritin: 72.8 ng/mL (ref 10.0–291.0)
Iron: 83 ug/dL (ref 42–145)
Saturation Ratios: 25.1 % (ref 20.0–50.0)
TIBC: 330.4 ug/dL (ref 250.0–450.0)
Transferrin: 236 mg/dL (ref 212.0–360.0)

## 2024-01-25 LAB — PROTIME-INR
INR: 1.1 ratio — ABNORMAL HIGH (ref 0.8–1.0)
Prothrombin Time: 12 s (ref 9.6–13.1)

## 2024-01-25 LAB — CBC WITH DIFFERENTIAL/PLATELET
Basophils Absolute: 0 K/uL (ref 0.0–0.1)
Basophils Relative: 0.6 % (ref 0.0–3.0)
Eosinophils Absolute: 0.1 K/uL (ref 0.0–0.7)
Eosinophils Relative: 1.7 % (ref 0.0–5.0)
HCT: 40.3 % (ref 36.0–46.0)
Hemoglobin: 13.6 g/dL (ref 12.0–15.0)
Lymphocytes Relative: 46.2 % — ABNORMAL HIGH (ref 12.0–46.0)
Lymphs Abs: 3.2 K/uL (ref 0.7–4.0)
MCHC: 33.8 g/dL (ref 30.0–36.0)
MCV: 90 fl (ref 78.0–100.0)
Monocytes Absolute: 0.5 K/uL (ref 0.1–1.0)
Monocytes Relative: 7.3 % (ref 3.0–12.0)
Neutro Abs: 3.1 K/uL (ref 1.4–7.7)
Neutrophils Relative %: 44.2 % (ref 43.0–77.0)
Platelets: 286 K/uL (ref 150.0–400.0)
RBC: 4.48 Mil/uL (ref 3.87–5.11)
RDW: 13.1 % (ref 11.5–15.5)
WBC: 7 K/uL (ref 4.0–10.5)

## 2024-01-25 NOTE — Patient Instructions (Signed)
 Your provider has requested that you go to the basement level for lab work before leaving today. Press B on the elevator. The lab is located at the first door on the left as you exit the elevator.  You have been scheduled for an abdominal ultrasound at [facility] (1st floor of hospital) on 02/04/24 at 9:00 am . Please arrive 30 minutes prior to your appointment for registration. Make certain not to have anything to eat or drink 8 hours prior to your appointment. Should you need to reschedule your appointment, please contact radiology at [phone]. This test typically takes about 30 minutes to perform.  Due to recent changes in healthcare laws, you may see the results of your imaging and laboratory studies on MyChart before your provider has had a chance to review them.  We understand that in some cases there may be results that are confusing or concerning to you. Not all laboratory results come back in the same time frame and the provider may be waiting for multiple results in order to interpret others.  Please give us  48 hours in order for your provider to thoroughly review all the results before contacting the office for clarification of your results.   _______________________________________________________  If your blood pressure at your visit was 140/90 or greater, please contact your primary care physician to follow up on this.  _______________________________________________________  If you are age 84 or older, your body mass index should be between 23-30. Your Body mass index is 35.51 kg/m. If this is out of the aforementioned range listed, please consider follow up with your Primary Care Provider.  If you are age 61 or younger, your body mass index should be between 19-25. Your Body mass index is 35.51 kg/m. If this is out of the aformentioned range listed, please consider follow up with your Primary Care Provider.    ________________________________________________________  The [facility] providers would like to encourage you to use MYCHART to communicate with providers for non-urgent requests or questions.  Due to long hold times on the telephone, sending your provider a message by [facility] may be a faster and more efficient way to get a response.  Please allow 48 business hours for a response.  Please remember that this is for non-urgent requests.  _______________________________________________________  [facility] is using a team-based approach to care.  Your team is made up of your doctor and two to three APPS. Our APPS (Nurse Practitioners and Physician Assistants) work with your physician to ensure care continuity for you. They are fully qualified to address your health concerns and develop a treatment plan. They communicate directly with your gastroenterologist to care for you. Seeing the Advanced Practice Practitioners on your physician's team can help you by facilitating care more promptly, often allowing for earlier appointments, access to diagnostic testing, procedures, and other specialty referrals.   Thank you for choosing me and [facility].  Delon Failing, PA-C

## 2024-01-25 NOTE — Progress Notes (Signed)
 Chief Complaint: Elevated ALT and fatty liver  HPI:    Rebecca [name] is a 53 year old female with a past medical history as listed below including anxiety, who was referred to me by Severa Rock HERO, FNP for a complaint of late LFT and fatty liver.      05/31/2023 CMP with an ALT of 70 (this is remained her baseline around the past year), other liver enzymes normal.  Lipid panel with high cholesterol.  Total cholesterol 209, triglycerides 165, LDL 138.    08/22/23 right upper quadrant ultrasound with hepatic steatosis.    Today, patient presents to clinic companied by her husband.  She tells me she has had an elevated ALT for years and it seems to be slowly increasing with time.  Apparently it was only normal once and she thinks she was on something like Wegovy  at the time which she read could actually help with fatty liver.  Patient is slightly worried about other etiologies for this, but most concerning to her is how to help with this fatty liver.  She went on a very strict 800-calorie diet and increased exercise and lost a total of 9 pounds in a couple of months and tells me that her ALT actually got worse.  Reports 1 alcoholic drink maybe once a month.    Reports her last colonoscopy was around 2019, unclear if she had polyps.    Denies fever, chills, weight loss, nausea, vomiting or family history of liver disease.  Past Medical History:  Diagnosis Date   Anxiety    Difficult intubation 11/09/2011   uneventful intubation using elective [facility]ath MAC 3 video laryngoscope 10/29/19 given prior history   Hyperlipidemia    Insomnia     Past Surgical History:  Procedure Laterality Date   ABDOMINAL HYSTERECTOMY  11/09/2011   hysterectomy with right ooporectomy   BREAST EXCISIONAL BIOPSY Left    OOPHORECTOMY Left 10/29/2019   right ooporectomy 11/09/11; left oophorectoy 10/29/19 for ovarian torsion   SHOULDER ARTHROSCOPY WITH SUBACROMIAL DECOMPRESSION AND BICEP TENDON REPAIR Right  04/19/2022   Procedure: RIGHT SHOULDER ARTHROSCOPY, SUBACROMIAL DECOMPRESSION,  BICEPS TENODESIS AND DEBRIDEMENT;  Surgeon: Addie Cordella Hamilton, MD;  Location: [facility] OR;  Service: Orthopedics;  Laterality: Right;   TONSILLECTOMY Bilateral     Current Outpatient Medications  Medication Sig Dispense Refill   amoxicillin -clavulanate (AUGMENTIN ) 875-125 MG tablet Take 1 tablet by mouth 2 (two) times daily. 20 tablet 0   atorvastatin  (LIPITOR) 20 MG tablet Take 1 tablet (20 mg total) by mouth daily. 90 tablet 1   diclofenac (VOLTAREN) 75 MG EC tablet Take 75 mg by mouth 2 (two) times daily.     fluticasone  (FLONASE ) 50 MCG/ACT nasal spray SPRAY 2 SPRAYS INTO EACH NOSTRIL EVERY DAY 48 mL 2   No current facility-administered medications for this visit.    Allergies as of 01/25/2024   (No Known Allergies)    Family History  Problem Relation Age of Onset   Depression Mother    Heart disease Mother    Diabetes Father    Hyperlipidemia Father    Cancer Maternal Grandfather    Diabetes Paternal Grandmother    Breast cancer Neg Hx     Social History   Socioeconomic History   Marital status: Married    Spouse name: Not on file   Number of children: Not on file   Years of education: Not on file   Highest education level: Bachelor's degree (e.g., BA, AB, BS)  Occupational History  Occupation: [employer]  Tobacco Use   Smoking status: Never   Smokeless tobacco: Never  Vaping Use   Vaping status: Never Used  Substance and Sexual Activity   Alcohol use: Yes    Comment: 1-2 drinks per week   Drug use: Never   Sexual activity: Yes    Comment: total hysterectomy  Other Topics Concern   Not on file  Social History Narrative   Not on file   Social Drivers of Health   Financial Resource Strain: Low Risk  (11/21/2023)   Overall Financial Resource Strain (CARDIA)    Difficulty of Paying Living Expenses: Not hard at all  Food Insecurity: No Food Insecurity (11/21/2023)    Hunger Vital Sign    Worried About Running Out of Food in the Last Year: Never true    Ran Out of Food in the Last Year: Never true  Transportation Needs: No Transportation Needs (11/21/2023)   PRAPARE - Administrator, Civil Service (Medical): No    Lack of Transportation (Non-Medical): No  Physical Activity: Sufficiently Active (11/21/2023)   Exercise Vital Sign    Days of Exercise per Week: 5 days    Minutes of Exercise per Session: 60 min  Stress: Stress Concern Present (11/21/2023)   [employer] of Occupational Health - Occupational Stress Questionnaire    Feeling of Stress: To some extent  Social Connections: Moderately Isolated (11/21/2023)   Social Connection and Isolation Panel    Frequency of Communication with Friends and Family: Three times a week    Frequency of Social Gatherings with Friends and Family: Patient declined    Attends Religious Services: Patient declined    Database administrator or Organizations: No    Attends Engineer, structural: Not on file    Marital Status: Married  Catering manager Violence: Not on file    Review of Systems:    Constitutional: No weight loss, fever or chills Skin: No rash  Cardiovascular: No chest pain Respiratory: No SOB Gastrointestinal: See HPI and otherwise negative Genitourinary: No dysuria  Neurological: No headache, dizziness or syncope Musculoskeletal: No new muscle or joint pain Hematologic: No bleeding  Psychiatric: No history of depression or anxiety   Physical Exam:  Vital signs: BP 124/86   Pulse 72   Ht 5' 5 (1.651 m)   Wt 213 lb 6.4 oz (96.8 kg)   LMP 10/30/2018 (Approximate) Comment: total hysterectmy  BMI 35.51 kg/m    Constitutional:   Pleasant overweight Caucasian female appears to be in NAD, Well developed, Well nourished, alert and cooperative Head:  Normocephalic and atraumatic. Eyes:   PEERL, EOMI. No icterus. Conjunctiva pink. Ears:  Normal auditory acuity. Neck:   Supple Throat: Oral cavity and pharynx without inflammation, swelling or lesion.  Respiratory: Respirations even and unlabored. Lungs clear to auscultation bilaterally.   No wheezes, crackles, or rhonchi.  Cardiovascular: Normal S1, S2. No MRG. Regular rate and rhythm. No peripheral edema, cyanosis or pallor.  Gastrointestinal:  Soft, nondistended, nontender. No rebound or guarding. Normal bowel sounds. No appreciable masses or hepatomegaly. Rectal:  Not performed.  Msk:  Symmetrical without gross deformities. Without edema, no deformity or joint abnormality.  Neurologic:  Alert and  oriented x4;  grossly normal neurologically.  Skin:   Dry and intact without significant lesions or rashes. Psychiatric:  Demonstrates good judgement and reason without abnormal affect or behaviors.  RELEVANT LABS AND IMAGING: CBC    Component Value Date/Time   WBC 8.3  08/15/2023 1618   WBC 7.8 03/23/2022 1422   RBC 4.77 08/15/2023 1618   RBC 4.58 03/23/2022 1422   HGB 14.4 08/15/2023 1618   HCT 43.6 08/15/2023 1618   PLT 319 08/15/2023 1618   MCV 91 08/15/2023 1618   MCH 30.2 08/15/2023 1618   MCH 31.0 03/23/2022 1422   MCHC 33.0 08/15/2023 1618   MCHC 33.9 03/23/2022 1422   RDW 13.1 08/15/2023 1618   LYMPHSABS 3.6 (H) 08/15/2023 1618   EOSABS 0.1 08/15/2023 1618   BASOSABS 0.0 08/15/2023 1618    CMP     Component Value Date/Time   NA 139 11/28/2023 1100   K 4.2 11/28/2023 1100   CL 101 11/28/2023 1100   CO2 22 11/28/2023 1100   GLUCOSE 87 11/28/2023 1100   GLUCOSE 115 (H) 03/23/2022 1422   BUN 12 11/28/2023 1100   CREATININE 0.84 11/28/2023 1100   CALCIUM  10.0 11/28/2023 1100   PROT 7.3 11/28/2023 1100   ALBUMIN 4.5 11/28/2023 1100   AST 30 11/28/2023 1100   ALT 70 (H) 11/28/2023 1100   ALKPHOS 103 11/28/2023 1100   BILITOT 0.3 11/28/2023 1100   GFRNONAA >60 03/23/2022 1422    Assessment: 1.  Elevated ALT: For at least the past year and a half or 2, likely related to fatty liver  below 2.  Fatty liver: Seen at time of ultrasound 3.  Screening for colorectal cancer: Patient reports her last colonoscopy in 2019, we will try to retrieve records from [facility]  Plan: 1.  Will try to get records from prior colonoscopy to make sure she is in the appropriate recall. 2.  Discussed diagnosis of fatty liver which is most likely what is causing her minimally elevated ALT over the years.  Unfortunately there are not any drugs specifically for this yet though they are working some that are in the pipeline.  Discussed that we still do not routinely prescribe Wegovy  or other products for this reason.  They are starting to be discussed if there is a certain level of fibrosis which we can work up further for her. 3.  Ordered further liver serologies to rule out other causes today.  CBC, CMP, PT/INR, IgG, IgA, iTTG, ANA, ASMA, AMA, alpha-1 antitrypsin, ceruloplasmin and hepatitis studies 4.  Ordered a right upper quadrant ultrasound with elastography 5.  Discussed calculating out a fib 4 score in order to see how things are going in for a baseline. 6.  Discussed a Mediterranean diet and slow and steady weight loss 7.  Patient to follow in clinic with me in 3 months or sooner if necessary.  Assigned to Dr. Nandigam today.  Rebecca Failing, PA-C [facility] 01/25/2024, 1:46 PM  Cc: Severa Rock HERO, FNP

## 2024-01-31 LAB — IGG: IgG (Immunoglobin G), Serum: 1241 mg/dL (ref 600–1640)

## 2024-01-31 LAB — HEPATITIS A ANTIBODY, TOTAL: Hepatitis A AB,Total: NONREACTIVE

## 2024-01-31 LAB — HEPATITIS B SURFACE ANTIGEN: Hepatitis B Surface Ag: NONREACTIVE

## 2024-01-31 LAB — MITOCHONDRIAL ANTIBODIES: Mitochondrial M2 Ab, IgG: <=20 U (ref <=20.0)

## 2024-01-31 LAB — HEPATITIS C ANTIBODY: Hepatitis C Ab: NONREACTIVE

## 2024-01-31 LAB — ANA: Anti Nuclear Antibody (ANA): POSITIVE — AB

## 2024-01-31 LAB — ALPHA-1-ANTITRYPSIN: A-1 Antitrypsin, Ser: 124 mg/dL (ref 83–199)

## 2024-01-31 LAB — HEPATITIS B SURFACE ANTIBODY,QUALITATIVE: Hep B S Ab: REACTIVE — AB

## 2024-01-31 LAB — TISSUE TRANSGLUTAMINASE, IGA: (tTG) Ab, IgA: <1 U/mL

## 2024-01-31 LAB — ANTI-NUCLEAR AB-TITER (ANA TITER): ANA Titer 1: 1:320 {titer} — ABNORMAL HIGH

## 2024-01-31 LAB — IGA: Immunoglobulin A: 197 mg/dL (ref 47–310)

## 2024-01-31 LAB — CERULOPLASMIN: Ceruloplasmin: 22 mg/dL (ref 14–48)

## 2024-01-31 LAB — ANTI-SMOOTH MUSCLE ANTIBODY, IGG: Actin (Smooth Muscle) Antibody (IGG): <20 U (ref <20)

## 2024-02-01 ENCOUNTER — Ambulatory Visit: Payer: Self-pay | Admitting: Physician Assistant

## 2024-02-01 ENCOUNTER — Other Ambulatory Visit: Payer: Self-pay

## 2024-02-01 DIAGNOSIS — R7689 Other specified abnormal immunological findings in serum: Secondary | ICD-10-CM

## 2024-02-01 NOTE — Progress Notes (Signed)
 Please let the patient know that her liver workup was negative.  Her ALT does remain elevated.  Interestingly her ANA is very high, this can indicate various rheumatologic diseases such as lupus or other.  I would like her to see rheumatology for further workup.  Please refer her to rheumatology for elevated ANA.  Delon Osler, PA-C

## 2024-02-04 ENCOUNTER — Ambulatory Visit (HOSPITAL_COMMUNITY)
Admission: RE | Admit: 2024-02-04 | Discharge: 2024-02-04 | Disposition: A | Source: Ambulatory Visit | Attending: Physician Assistant

## 2024-02-04 DIAGNOSIS — K760 Fatty (change of) liver, not elsewhere classified: Secondary | ICD-10-CM | POA: Insufficient documentation

## 2024-02-04 DIAGNOSIS — Z944 Liver transplant status: Secondary | ICD-10-CM | POA: Diagnosis not present

## 2024-02-04 DIAGNOSIS — R7401 Elevation of levels of liver transaminase levels: Secondary | ICD-10-CM | POA: Insufficient documentation

## 2024-02-06 NOTE — Progress Notes (Signed)
 Delon, please address patient's concern.  She will be a candidate for GLP 1

## 2024-02-07 DIAGNOSIS — H40013 Open angle with borderline findings, low risk, bilateral: Secondary | ICD-10-CM | POA: Diagnosis not present

## 2024-02-08 ENCOUNTER — Other Ambulatory Visit: Payer: Self-pay

## 2024-02-08 MED ORDER — SEMAGLUTIDE-WEIGHT MANAGEMENT 1 MG/0.5ML ~~LOC~~ SOAJ: 1.0000 mg | SUBCUTANEOUS | 0 refills | Status: DC

## 2024-02-08 MED ORDER — SEMAGLUTIDE-WEIGHT MANAGEMENT 0.5 MG/0.5ML ~~LOC~~ SOAJ: 0.5000 mg | SUBCUTANEOUS | 0 refills | Status: AC

## 2024-02-08 MED ORDER — SEMAGLUTIDE-WEIGHT MANAGEMENT 1.7 MG/0.75ML ~~LOC~~ SOAJ: 1.7000 mg | SUBCUTANEOUS | 0 refills | Status: DC

## 2024-02-08 MED ORDER — SEMAGLUTIDE-WEIGHT MANAGEMENT 2.4 MG/0.75ML ~~LOC~~ SOAJ: 2.4000 mg | SUBCUTANEOUS | 0 refills | Status: DC

## 2024-02-08 MED ORDER — SEMAGLUTIDE-WEIGHT MANAGEMENT 0.25 MG/0.5ML ~~LOC~~ SOAJ: 0.2500 mg | SUBCUTANEOUS | 0 refills | Status: AC

## 2024-02-12 ENCOUNTER — Ambulatory Visit (INDEPENDENT_AMBULATORY_CARE_PROVIDER_SITE_OTHER): Admitting: Dermatology

## 2024-02-12 ENCOUNTER — Encounter: Payer: Self-pay | Admitting: Dermatology

## 2024-02-12 VITALS — BP 131/81 | HR 84

## 2024-02-12 DIAGNOSIS — Z1283 Encounter for screening for malignant neoplasm of skin: Secondary | ICD-10-CM

## 2024-02-12 DIAGNOSIS — L732 Hidradenitis suppurativa: Secondary | ICD-10-CM

## 2024-02-12 DIAGNOSIS — L578 Other skin changes due to chronic exposure to nonionizing radiation: Secondary | ICD-10-CM

## 2024-02-12 DIAGNOSIS — D229 Melanocytic nevi, unspecified: Secondary | ICD-10-CM

## 2024-02-12 DIAGNOSIS — W908XXA Exposure to other nonionizing radiation, initial encounter: Secondary | ICD-10-CM

## 2024-02-12 DIAGNOSIS — L814 Other melanin hyperpigmentation: Secondary | ICD-10-CM | POA: Diagnosis not present

## 2024-02-12 DIAGNOSIS — L738 Other specified follicular disorders: Secondary | ICD-10-CM

## 2024-02-12 DIAGNOSIS — L821 Other seborrheic keratosis: Secondary | ICD-10-CM

## 2024-02-12 DIAGNOSIS — D1801 Hemangioma of skin and subcutaneous tissue: Secondary | ICD-10-CM

## 2024-02-12 MED ORDER — DOXYCYCLINE HYCLATE 100 MG PO TABS
100.0000 mg | ORAL_TABLET | Freq: Two times a day (BID) | ORAL | 6 refills | Status: AC
Start: 2024-02-12 — End: 2024-02-22

## 2024-02-12 NOTE — Patient Instructions (Signed)

## 2024-02-12 NOTE — Progress Notes (Signed)
   New Patient Visit   Subjective  Rebecca [name] is a 53 y.o. female who presents for the following: Skin Cancer Screening and Full Body Skin Exam  The patient presents for Total-Body Skin Exam (TBSE) for skin cancer screening and mole check. The patient has spots, moles and lesions to be evaluated, some may be new or changing.  The following portions of the chart were reviewed this encounter and updated as appropriate: medications, allergies, medical history  Review of Systems:  No other skin or systemic complaints except as noted in HPI or Assessment and Plan.  Objective  Well appearing patient in no apparent distress; mood and affect are within normal limits.  A full examination was performed including scalp, head, eyes, ears, nose, lips, neck, chest, axillae, abdomen, back, buttocks, bilateral upper extremities, bilateral lower extremities, hands, feet, fingers, toes, fingernails, and toenails. All findings within normal limits unless otherwise noted below.   Relevant physical exam findings are noted in the Assessment and Plan.    Assessment & Plan   SKIN CANCER SCREENING PERFORMED TODAY.  ACTINIC DAMAGE - Chronic condition, secondary to cumulative UV/sun exposure - diffuse scaly erythematous macules with underlying dyspigmentation - Recommend daily broad spectrum sunscreen SPF 30+ to sun-exposed areas, reapply every 2 hours as needed.  - Staying in the shade or wearing long sleeves, sun glasses (UVA+UVB protection) and wide brim hats (4-inch brim around the entire circumference of the hat) are also recommended for sun protection.  - Call for new or changing lesions.  LENTIGINES, SEBORRHEIC KERATOSES, HEMANGIOMAS - Benign normal skin lesions - Benign-appearing - Call for any changes  MELANOCYTIC NEVI - Tan-brown and/or pink-flesh-colored symmetric macules and papules - Benign appearing on exam today - Observation - Call clinic for new or changing moles - Recommend  daily use of broad spectrum spf 30+ sunscreen to sun-exposed areas.   HIDRADENITIS SUPPURATIVA Exam: left underarm   flared   Hidradenitis Suppurativa is a chronic; persistent; non-curable, but treatable condition due to abnormal inflamed sweat glands in the body folds (axilla, inframammary, groin, medial thighs), causing recurrent painful draining cysts and scarring. It can be associated with severe scarring acne and cysts; also abscesses and scarring of scalp. The goal is control and prevention of flares, as it is not curable. Scars are permanent and can be thickened. Treatment may include daily use of topical medication and oral antibiotics.  Oral isotretinoin may also be helpful.  For some cases, Humira or Cosentyx (biologic injections) may be prescribed to decrease the inflammatory process and prevent flares.  When indicated, inflamed cysts may also be treated surgically.  Treatment Plan: -take doxy 100  mg BID for 10 days for flairs -Use Hibiclens  daily to prevent flairs   Sebaceous Hyperplasia- face - Small yellow papules with a central dell - Benign-appearing - Observe. Call for changes.    Return in about 1 year (around 02/11/2025) for [facility] also a cosmetic app with Dr. Alm.  I, Berwyn Lesches, Surg Tech III, am acting as scribe for RUFUS CHRISTELLA HOLY, MD.   Documentation: I have reviewed the above documentation for accuracy and completeness, and I agree with the above.  RUFUS CHRISTELLA HOLY, MD

## 2024-02-21 ENCOUNTER — Other Ambulatory Visit: Payer: Self-pay | Admitting: Medical Genetics

## 2024-02-21 DIAGNOSIS — Z006 Encounter for examination for normal comparison and control in clinical research program: Secondary | ICD-10-CM

## 2024-02-22 ENCOUNTER — Encounter: Payer: Self-pay | Admitting: Family Medicine

## 2024-02-22 DIAGNOSIS — M9901 Segmental and somatic dysfunction of cervical region: Secondary | ICD-10-CM | POA: Diagnosis not present

## 2024-02-22 DIAGNOSIS — M9902 Segmental and somatic dysfunction of thoracic region: Secondary | ICD-10-CM | POA: Diagnosis not present

## 2024-02-22 DIAGNOSIS — M9903 Segmental and somatic dysfunction of lumbar region: Secondary | ICD-10-CM | POA: Diagnosis not present

## 2024-02-22 DIAGNOSIS — M62830 Muscle spasm of back: Secondary | ICD-10-CM | POA: Diagnosis not present

## 2024-02-23 NOTE — Telephone Encounter (Signed)
 I called and spoke with patient and got her scheduled for a video visit with PCP next week.

## 2024-02-27 ENCOUNTER — Telehealth: Admitting: Family Medicine

## 2024-02-27 ENCOUNTER — Encounter: Payer: Self-pay | Admitting: Family Medicine

## 2024-02-27 DIAGNOSIS — F5101 Primary insomnia: Secondary | ICD-10-CM

## 2024-02-27 DIAGNOSIS — M62830 Muscle spasm of back: Secondary | ICD-10-CM | POA: Diagnosis not present

## 2024-02-27 DIAGNOSIS — E8941 Symptomatic postprocedural ovarian failure: Secondary | ICD-10-CM | POA: Diagnosis not present

## 2024-02-27 DIAGNOSIS — N959 Unspecified menopausal and perimenopausal disorder: Secondary | ICD-10-CM | POA: Insufficient documentation

## 2024-02-27 MED ORDER — ESTRADIOL 0.025 MG/24HR TD PTWK: 0.0250 mg | MEDICATED_PATCH | TRANSDERMAL | 12 refills | Status: DC

## 2024-02-27 MED ORDER — CYCLOBENZAPRINE HCL 5 MG PO TABS: 5.0000 mg | ORAL_TABLET | Freq: Three times a day (TID) | ORAL | 1 refills | Status: DC | PRN

## 2024-02-27 NOTE — Progress Notes (Signed)
 Virtual Visit via Video   I connected with patient on 02/27/24 at 1550 by a video enabled telemedicine application and verified that I am speaking with the correct person using two identifiers.  Location patient: Home Location provider: [facility] Persons participating in the virtual visit: Patient and Provider  I discussed the limitations of evaluation and management by telemedicine and the availability of in person appointments. The patient expressed understanding and agreed to proceed.  Subjective:   HPI:  Pt presents today for  Chief Complaint  Patient presents with   Menopause   She has been experiencing significant menopausal symptoms since discontinuing estrogen therapy. Initially, she used oral estrogen pills but stopped due to elevated blood levels despite dose reduction. Currently, she experiences severe hot flashes, insomnia, hair loss, brain fog, and decreased libido. These symptoms have worsened since stopping estrogen, and she feels 'completely depleted'.  Regarding sleep disturbances, she has tried doxepin  and trazodone  in the past, both of which eventually stopped being effective. She is currently using over-the-counter Unisom, which she has been taking for the past five to six months, but it is no longer effective. Her sleep issues have worsened since discontinuing estrogen therapy.  She also mentions a recent back issue, attributed to sleeping in an awkward position. She has been seeing a land and had an adjustment last Thursday, with a follow-up appointment scheduled for this Thursday. The pain is in the center of her back, and Advil has not been effective in alleviating the discomfort.  She did not report any significant side effects from previous estrogen therapy. No known issues with blood pressure, which was last recorded at 123/80 during a visit in July.       ROS per HPI  Patient Active Problem List   Diagnosis Date  Noted   Menopausal disorder 02/27/2024   Hepatic steatosis 11/28/2023   Family history of melanoma 11/28/2023   Polyphagia 11/10/2022   Elevated cholesterol 11/10/2022   Elevated ALT measurement 10/19/2022   Other fatigue 10/18/2022   SOB (shortness of breath) on exertion 10/18/2022   Insulin  resistance 10/18/2022   Vitamin D  deficiency 10/18/2022   Health care maintenance 10/18/2022   Generalized obesity 10/18/2022   GAD (generalized anxiety disorder) 04/28/2021   Insomnia 01/14/2021   Mixed hyperlipidemia 01/14/2021   BMI 35.0-35.9,adult 01/14/2021   History of total hysterectomy 01/14/2021   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   FH: diabetes mellitus 06/30/2020   FH: breast cancer 06/30/2020   Family history of malignant neoplasm of digestive organs 02/23/2012    Social History   Tobacco Use   Smoking status: Former    Current packs/day: 0.00    Types: Cigarettes    Quit date: 2015    Years since quitting: 10.8   Smokeless tobacco: Never  Substance Use Topics   Alcohol use: Yes    Comment: maybe 1 drink a month    Current Outpatient Medications:    cyclobenzaprine (FLEXERIL) 5 MG tablet, Take 1 tablet (5 mg total) by mouth 3 (three) times daily as needed for muscle spasms., Disp: 30 tablet, Rfl: 1   estradiol  (CLIMARA  - DOSED IN MG/24 HR) 0.025 mg/24hr patch, Place 1 patch (0.025 mg total) onto the skin once a week., Disp: 4 patch, Rfl: 12   atorvastatin  (LIPITOR) 20 MG tablet, Take 1 tablet (20 mg total) by mouth daily., Disp: 90 tablet, Rfl: 1   cholecalciferol (VITAMIN D3) 25 MCG (1000 UNIT) tablet, Take 1,000 Units  by mouth daily., Disp: , Rfl:    diclofenac (VOLTAREN) 75 MG EC tablet, Take 75 mg by mouth 2 (two) times daily. (Patient taking differently: Take 75 mg by mouth daily.), Disp: , Rfl:    doxylamine, Sleep, (UNISOM) 25 MG tablet, Take 25 mg by mouth at bedtime as needed., Disp: , Rfl:    fluticasone  (FLONASE ) 50 MCG/ACT nasal spray, SPRAY 2  SPRAYS INTO EACH NOSTRIL EVERY DAY, Disp: 48 mL, Rfl: 2   semaglutide -weight management (WEGOVY ) 0.25 MG/0.5ML SOAJ SQ injection, Inject 0.25 mg into the skin once a week for 28 days., Disp: 2 mL, Rfl: 0   [START ON 03/08/2024] semaglutide -weight management (WEGOVY ) 0.5 MG/0.5ML SOAJ SQ injection, Inject 0.5 mg into the skin once a week for 28 days., Disp: 2 mL, Rfl: 0   [START ON 04/06/2024] semaglutide -weight management (WEGOVY ) 1 MG/0.5ML SOAJ SQ injection, Inject 1 mg into the skin once a week for 28 days., Disp: 2 mL, Rfl: 0   [START ON 05/05/2024] semaglutide -weight management (WEGOVY ) 1.7 MG/0.75ML SOAJ SQ injection, Inject 1.7 mg into the skin once a week for 28 days., Disp: 3 mL, Rfl: 0   [START ON 06/03/2024] semaglutide -weight management (WEGOVY ) 2.4 MG/0.75ML SOAJ SQ injection, Inject 2.4 mg into the skin once a week for 28 days., Disp: 3 mL, Rfl: 0  No Known Allergies  Objective:   LMP 10/30/2018 (Approximate) Comment: total hysterectmy  Patient is well-developed, well-nourished in no acute distress.  Resting comfortably at home.  Head is normocephalic, atraumatic.  No labored breathing.  Speech is clear and coherent with logical content.  Patient is alert and oriented at baseline.   Assessment and Plan:   Season was seen today for menopause.  Diagnoses and all orders for this visit:  Hot flashes due to surgical menopause -     estradiol  (CLIMARA  - DOSED IN MG/24 HR) 0.025 mg/24hr patch; Place 1 patch (0.025 mg total) onto the skin once a week.  Menopausal disorder -     estradiol  (CLIMARA  - DOSED IN MG/24 HR) 0.025 mg/24hr patch; Place 1 patch (0.025 mg total) onto the skin once a week.  Primary insomnia -     estradiol  (CLIMARA  - DOSED IN MG/24 HR) 0.025 mg/24hr patch; Place 1 patch (0.025 mg total) onto the skin once a week.  Spasm of muscle of lower back -     cyclobenzaprine (FLEXERIL) 5 MG tablet; Take 1 tablet (5 mg total) by mouth 3 (three) times daily as needed for  muscle spasms.      Menopausal symptoms with insomnia Experiencing significant menopausal symptoms including hot flashes, insomnia, hair loss, brain fog, and decreased libido. Previously tried oral estradiol  but discontinued due to elevated blood levels despite dose reduction. No significant side effects from past estrogen therapy. Various sleep aids including doxepin , trazodone , and over-the-counter Unisom have become ineffective. Discussed risks of estrogen therapy including increased risk of certain cancers, blood clots, and stroke, but noted these risks are manageable with proper monitoring. Estrogen patch recommended due to systemic symptoms. - Prescribe estradiol  patch 0.025 mg/day, to be applied weekly. - Instruct to monitor blood pressure regularly, aiming for 130/80 mmHg or below. - Advise to report any symptoms such as chest pain, shortness of breath, or unilateral leg swelling. - Plan to recheck hormone levels in early February. - Instruct to message if sleep does not improve after 2-4 weeks of patch use for further evaluation.  Acute low back pain Reports acute low back pain likely due to  sleeping in an awkward position. Over-the-counter Advil ineffective. - Prescribe low-dose Flexeril to be taken as needed, especially before chiropractic adjustments. - Advise to continue chiropractic care and follow up with the chiropractor as planned.        Return if symptoms worsen or fail to improve.  Rosaline Bruns, FNP-C [facility] [address] [place], [place] [address] [phone]  02/27/2024  Time spent with the patient: 15 minutes, of which >50% was spent in obtaining information about symptoms, reviewing previous labs, evaluations, and treatments, counseling about condition (please see the discussed topics above), and developing a plan to further investigate it; had a number of questions which I addressed.

## 2024-02-28 ENCOUNTER — Telehealth: Payer: Self-pay

## 2024-02-28 NOTE — Telephone Encounter (Unsigned)
 Copied from CRM [id number]. Topic: Clinical - Medical Advice >> Feb 28, 2024  7:39 AM Harlene ORN wrote: Reason for CRM: Please call the patient back to discuss mammography calendar for January.

## 2024-02-29 DIAGNOSIS — M9903 Segmental and somatic dysfunction of lumbar region: Secondary | ICD-10-CM | POA: Diagnosis not present

## 2024-02-29 DIAGNOSIS — M9902 Segmental and somatic dysfunction of thoracic region: Secondary | ICD-10-CM | POA: Diagnosis not present

## 2024-02-29 DIAGNOSIS — M9901 Segmental and somatic dysfunction of cervical region: Secondary | ICD-10-CM | POA: Diagnosis not present

## 2024-02-29 DIAGNOSIS — M62830 Muscle spasm of back: Secondary | ICD-10-CM | POA: Diagnosis not present

## 2024-03-04 ENCOUNTER — Encounter: Payer: Self-pay | Admitting: Family Medicine

## 2024-03-04 NOTE — Telephone Encounter (Signed)
 I do not see her on a waitlist anywhere.

## 2024-03-14 ENCOUNTER — Other Ambulatory Visit: Payer: Self-pay | Admitting: *Deleted

## 2024-03-14 DIAGNOSIS — M62830 Muscle spasm of back: Secondary | ICD-10-CM

## 2024-03-17 ENCOUNTER — Encounter: Payer: Self-pay | Admitting: Family Medicine

## 2024-03-18 ENCOUNTER — Encounter: Payer: Self-pay | Admitting: Family Medicine

## 2024-03-18 ENCOUNTER — Other Ambulatory Visit: Payer: Self-pay

## 2024-03-18 DIAGNOSIS — F5101 Primary insomnia: Secondary | ICD-10-CM

## 2024-03-18 DIAGNOSIS — E8941 Symptomatic postprocedural ovarian failure: Secondary | ICD-10-CM

## 2024-03-18 DIAGNOSIS — N959 Unspecified menopausal and perimenopausal disorder: Secondary | ICD-10-CM

## 2024-03-18 MED ORDER — ESTRADIOL 0.025 MG/24HR TD PTWK: 0.0250 mg | MEDICATED_PATCH | TRANSDERMAL | 3 refills | Status: AC

## 2024-03-19 ENCOUNTER — Encounter: Payer: Self-pay | Admitting: Family Medicine

## 2024-03-19 ENCOUNTER — Ambulatory Visit: Admitting: Family Medicine

## 2024-03-19 ENCOUNTER — Ambulatory Visit: Admitting: Family

## 2024-03-19 ENCOUNTER — Ambulatory Visit (INDEPENDENT_AMBULATORY_CARE_PROVIDER_SITE_OTHER): Admitting: Family Medicine

## 2024-03-19 VITALS — BP 125/87 | HR 76 | Temp 97.7°F | Ht 65.0 in | Wt 212.4 lb

## 2024-03-19 DIAGNOSIS — H66004 Acute suppurative otitis media without spontaneous rupture of ear drum, recurrent, right ear: Secondary | ICD-10-CM | POA: Diagnosis not present

## 2024-03-19 DIAGNOSIS — F5101 Primary insomnia: Secondary | ICD-10-CM

## 2024-03-19 DIAGNOSIS — J3489 Other specified disorders of nose and nasal sinuses: Secondary | ICD-10-CM | POA: Diagnosis not present

## 2024-03-19 LAB — GENECONNECT MOLECULAR SCREEN: Genetic Analysis Overall Interpretation: NEGATIVE

## 2024-03-19 MED ORDER — BELSOMRA 5 MG PO TABS: 5.0000 mg | ORAL_TABLET | Freq: Every evening | ORAL | 0 refills | Status: DC | PRN

## 2024-03-19 MED ORDER — AMOXICILLIN-POT CLAVULANATE 875-125 MG PO TABS: 1.0000 | ORAL_TABLET | Freq: Two times a day (BID) | ORAL | 0 refills | Status: DC

## 2024-03-19 MED ORDER — PREDNISONE 20 MG PO TABS: 20.0000 mg | ORAL_TABLET | Freq: Every day | ORAL | 0 refills | Status: AC

## 2024-03-19 NOTE — Progress Notes (Signed)
 Subjective:  Patient ID: Rebecca [name], female    DOB: [birth year], 53 y.o.   MRN: [id number]  Patient Care Team: Severa Rock HERO, FNP as PCP - General (Family Medicine)   Chief Complaint:  Ear Pain (Bilateral ), Sore Throat, Nasal Congestion, and Generalized Body Aches (X 2 days )   HPI: Rebecca [name] is a 53 y.o. female presenting on 03/19/2024 for Ear Pain (Bilateral ), Sore Throat, Nasal Congestion, and Generalized Body Aches (X 2 days )  Rebecca [name] is a 53 year old female with recurrent ear infections who presents with symptoms of a recurrent ear infection.  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  Allergies and medications reviewed and updated. Data reviewed: Chart in Epic.   Past Medical History:  Diagnosis Date   Anxiety    Depression    Difficult intubation 11/09/2011   uneventful intubation using elective Mc[facility]ath MAC 3 video laryngoscope 10/29/19 given prior history    Hyperlipidemia    Insomnia     Past Surgical History:  Procedure Laterality Date   ABDOMINAL HYSTERECTOMY  11/09/2011   hysterectomy with right ooporectomy   BREAST EXCISIONAL BIOPSY Left    OOPHORECTOMY Left 10/29/2019   right ooporectomy 11/09/11; left oophorectoy 10/29/19 for ovarian torsion   SHOULDER ARTHROSCOPY WITH SUBACROMIAL DECOMPRESSION AND BICEP TENDON REPAIR Right 04/19/2022   Procedure: RIGHT SHOULDER ARTHROSCOPY, SUBACROMIAL DECOMPRESSION,  BICEPS TENODESIS AND DEBRIDEMENT;  Surgeon: Addie Cordella Hamilton, MD;  Location: [facility] OR;  Service: Orthopedics;  Laterality: Right;   TONSILLECTOMY Bilateral     Social History   Socioeconomic History   Marital status: Married    Spouse name: Not on file   Number of children: Not on file   Years of education: Not on file   Highest education level: Bachelor's degree (e.g., BA, AB, BS)  Occupational History   Occupation: [employer]  Tobacco Use   Smoking status: Former    Current packs/day: 0.00    Types: Cigarettes    Quit date: 2015    Years since quitting: 10.8   Smokeless tobacco: Never  Vaping Use   Vaping status: Never Used  Substance and Sexual Activity   Alcohol use: Yes    Comment: maybe 1 drink a month   Drug use: Never   Sexual activity: Yes    Comment: total hysterectomy  Other Topics Concern   Not on file  Social History Narrative   Not on file   Social Drivers of Health   Financial Resource Strain: Low Risk  (02/23/2024)   Overall  Financial Resource Strain (CARDIA)    Difficulty of Paying Living Expenses: Not hard at all  Food Insecurity: No Food Insecurity (02/23/2024)   Hunger Vital Sign    Worried About Running Out of Food in the Last Year: Never true    Ran Out of Food in the Last Year: Never true  Transportation Needs: No Transportation Needs (02/23/2024)   PRAPARE - Administrator, Civil Service (Medical): No    Lack of Transportation (Non-Medical): No  Physical  Activity: Sufficiently Active (02/23/2024)   Exercise Vital Sign    Days of Exercise per Week: 5 days    Minutes of Exercise per Session: 60 min  Stress: No Stress Concern Present (02/23/2024)   [employer] of Occupational Health - Occupational Stress Questionnaire    Feeling of Stress: Only a little  Social Connections: Moderately Isolated (02/23/2024)   Social Connection and Isolation Panel    Frequency of Communication with Friends and Family: Three times a week    Frequency of Social Gatherings with Friends and Family: Patient declined    Attends Religious Services: Patient declined    Active Member of Clubs or Organizations: No    Attends Engineer, Structural: Not on file    Marital Status: Married  Intimate Partner Violence: Not on file    Outpatient Encounter Medications as of 03/19/2024  Medication Sig   amoxicillin -clavulanate (AUGMENTIN ) 875-125 MG tablet Take 1 tablet by mouth 2 (two) times daily.   atorvastatin  (LIPITOR) 20 MG tablet Take 1 tablet (20 mg total) by mouth daily.   cholecalciferol (VITAMIN D3) 25 MCG (1000 UNIT) tablet Take 1,000 Units by mouth daily.   cyclobenzaprine (FLEXERIL) 5 MG tablet TAKE 1 TABLET BY MOUTH THREE TIMES A DAY AS NEEDED FOR MUSCLE SPASMS   diclofenac (VOLTAREN) 75 MG EC tablet Take 75 mg by mouth 2 (two) times daily. (Patient taking differently: Take 75 mg by mouth daily.)   doxylamine, Sleep, (UNISOM) 25 MG tablet Take 25 mg by mouth at bedtime as needed.   estradiol  (CLIMARA  - DOSED IN MG/24 HR) 0.025 mg/24hr patch Place 1 patch (0.025 mg total) onto the skin once a week.   fluticasone  (FLONASE ) 50 MCG/ACT nasal spray SPRAY 2 SPRAYS INTO EACH NOSTRIL EVERY DAY   predniSONE  (DELTASONE ) 20 MG tablet Take 1 tablet (20 mg total) by mouth daily with breakfast for 5 days.   Suvorexant (BELSOMRA) 5 MG TABS Take 1 tablet (5 mg total) by mouth at bedtime as needed (insomnia).   semaglutide -weight management (WEGOVY ) 0.5 MG/0.5ML  SOAJ SQ injection Inject 0.5 mg into the skin once a week for 28 days. (Patient not taking: Reported on 03/19/2024)   [START ON 04/06/2024] semaglutide -weight management (WEGOVY ) 1 MG/0.5ML SOAJ SQ injection Inject 1 mg into the skin once a week for 28 days. (Patient not taking: Reported on 03/19/2024)   [START ON 05/05/2024] semaglutide -weight management (WEGOVY ) 1.7 MG/0.75ML SOAJ SQ injection Inject 1.7 mg into the skin once a week for 28 days. (Patient not taking: Reported on 03/19/2024)   [START ON 06/03/2024] semaglutide -weight management (WEGOVY ) 2.4 MG/0.75ML SOAJ SQ injection Inject 2.4 mg into the skin once a week for 28 days. (Patient not taking: Reported on 03/19/2024)   No facility-administered encounter medications on file as of 03/19/2024.    No Known Allergies  Pertinent ROS per HPI, otherwise unremarkable      Objective:  BP 125/87   Pulse 76   Temp 97.7 F (36.5 C)   Ht 5'  5 (1.651 m)   Wt 212 lb 6.4 oz (96.3 kg)   LMP 10/30/2018 (Approximate) Comment: total hysterectmy  SpO2 98%   BMI 35.35 kg/m    Wt Readings from Last 3 Encounters:  03/19/24 212 lb 6.4 oz (96.3 kg)  01/25/24 213 lb 6.4 oz (96.8 kg)  11/28/23 205 lb 6.4 oz (93.2 kg)    Physical Exam Vitals and nursing note reviewed.  Constitutional:      General: She is not in acute distress.    Appearance: Normal appearance. She is well-developed and well-groomed. She is obese. She is not ill-appearing, toxic-appearing or diaphoretic.  HENT:     Head: Normocephalic and atraumatic.     Jaw: There is normal jaw occlusion.     Right Ear: Hearing normal. Tympanic membrane is erythematous and bulging.     Left Ear: Hearing normal. A middle ear effusion is present.     Nose: Congestion present.     Right Sinus: Maxillary sinus tenderness and frontal sinus tenderness present.     Left Sinus: Maxillary sinus tenderness and frontal sinus tenderness present.     Mouth/Throat:     Lips: Pink.     Mouth: Mucous  membranes are moist.     Pharynx: Oropharynx is clear. Uvula midline. Postnasal drip present. No oropharyngeal exudate or posterior oropharyngeal erythema.  Eyes:     General: Lids are normal.     Extraocular Movements: Extraocular movements intact.     Conjunctiva/sclera: Conjunctivae normal.     Pupils: Pupils are equal, round, and reactive to light.  Neck:     Thyroid : No thyroid  mass, thyromegaly or thyroid  tenderness.     Trachea: Trachea and phonation normal.  Cardiovascular:     Rate and Rhythm: Normal rate and regular rhythm.     Chest Wall: PMI is not displaced.     Pulses: Normal pulses.     Heart sounds: Normal heart sounds. No murmur heard.    No friction rub. No gallop.  Pulmonary:     Effort: Pulmonary effort is normal. No respiratory distress.     Breath sounds: Normal breath sounds. No wheezing.  Abdominal:     Palpations: Abdomen is soft.  Musculoskeletal:        General: Normal range of motion.     Cervical back: Normal range of motion and neck supple.     Right lower leg: No edema.     Left lower leg: No edema.  Lymphadenopathy:     Cervical: No cervical adenopathy.  Skin:    General: Skin is warm and dry.     Capillary Refill: Capillary refill takes less than 2 seconds.     Coloration: Skin is not cyanotic, jaundiced or pale.     Findings: No rash.  Neurological:     General: No focal deficit present.     Mental Status: She is alert and oriented to person, place, and time.     Sensory: Sensation is intact.     Motor: Motor function is intact.     Coordination: Coordination is intact.     Gait: Gait is intact.     Deep Tendon Reflexes: Reflexes are normal and symmetric.  Psychiatric:        Attention and Perception: Attention and perception normal.        Mood and Affect: Mood and affect normal.        Speech: Speech normal.        Behavior: Behavior normal. Behavior is  cooperative.        Thought Content: Thought content normal.        Cognition and  Memory: Cognition and memory normal.        Judgment: Judgment normal.      Results for orders placed or performed in visit on 02/21/24  Gene[facility] Molecular Screen   Collection Time: 03/09/24  5:51 PM  Result Value Ref Range   Genetic Analysis Overall Interpretation Negative    Genetic Disease Assessed      This is a screening test and does not detect all pathogenic or likely pathogenic variant(s) in the tested genes; diagnostic testing is recommended for individuals with a personal or family history of heart disease or hereditary cancer. [employer] One  Population Screen is a screening test that analyzes 11 genes related to hereditary breast and ovarian cancer (HBOC) syndrome, Lynch syndrome, and familial hypercholesterolemia. This test only reports clinically significant pathogenic and likely  pathogenic variants but does not report variants of uncertain significance (VUS). In addition, analysis of the PMS2 gene excludes exons 11-15, which overlap with a known pseudogene (PMS2CL).    Genetic Analysis Report      No pathogenic or likely pathogenic variants were detected in the genes analyzed by this test.Genetic test results should be interpreted in the context of an individual's personal medical and family history. Alteration to medical management is NOT  recommended based solely on this result. Clinical correlation is advised.Additional Considerations- This is a screening test; individuals may still carry pathogenic or likely pathogenic variant(s) in the tested genes that are not detected by this test.-  For individuals at risk for these or other related conditions based on factors including personal or family history, diagnostic testing is recommended.- The absence of pathogenic or likely pathogenic variant(s) in the analyzed genes, while reassuring,  does not eliminate the possibility of a hereditary condition; there are other variants and genes associated with heart disease and hereditary  cancer that are not included in this test.    Genes Tested See Notes    Disclaimer See Notes    Sequencing Location See Notes    Interpretation Methods and Limitations See Notes        Pertinent labs & imaging results that were available during my care of the patient were reviewed by me and considered in my medical decision making.  Assessment & Plan:  Jazmin was seen today for ear pain, sore throat, nasal congestion and generalized body aches.  Diagnoses and all orders for this visit:  Recurrent acute suppurative otitis media of right ear without spontaneous rupture of tympanic membrane -     amoxicillin -clavulanate (AUGMENTIN ) 875-125 MG tablet; Take 1 tablet by mouth 2 (two) times daily. -     predniSONE  (DELTASONE ) 20 MG tablet; Take 1 tablet (20 mg total) by mouth daily with breakfast for 5 days.  Sinus pressure -     amoxicillin -clavulanate (AUGMENTIN ) 875-125 MG tablet; Take 1 tablet by mouth 2 (two) times daily. -     predniSONE  (DELTASONE ) 20 MG tablet; Take 1 tablet (20 mg total) by mouth daily with breakfast for 5 days.  Primary insomnia -     Suvorexant (BELSOMRA) 5 MG TABS; Take 1 tablet (5 mg total) by mouth at bedtime as needed (insomnia).      Acute recurrent suppurative otitis media, right ear Recurrent ear infection in the right ear, similar to previous episodes in August. Symptoms began suddenly on Sunday evening with ear pain and drainage. Negative  COVID test at home. No fever, but mild sinus pressure and dental pain present. Previous treatment with amoxicillin  was effective, but due to recurrence, Augmentin  is chosen for its stronger efficacy. Prednisone  is added to aid fluid drainage and alleviate sinus pressure. - Prescribed Augmentin  for ear infection - Prescribed prednisone  to aid fluid drainage and alleviate sinus pressure - Advised to monitor for symptoms of yeast infection post-antibiotic use  Disorder of nose and nasal sinuses Sinus pressure and dental  pain associated with ear infection. No significant fever reported. Symptoms likely exacerbated by exposure to sick individuals in an [employer] classroom setting. - Continue Flonase  for sinus pressure  Insomnia Chronic insomnia with difficulty falling and staying asleep. Previous trials of trazodone  and doxepin  were ineffective, with trazodone  causing headaches and nasal congestion. Estroval patch has reduced hot flashes but not improved sleep. Belsomra is chosen for its efficacy in treating insomnia, especially given the failure of other medications. - Prescribed Belsomra 5 mg tablets for insomnia - Instructed to increase dose to 10 mg if no improvement after one week - Advised to take Belsomra 30 minutes before bedtime - Discussed controlled substance agreement for continued use if effective - Advised to maintain a good nighttime routine and minimize distractions          Continue all other maintenance medications.  Follow up plan: Return if symptoms worsen or fail to improve.   Continue healthy lifestyle choices, including diet (rich in fruits, vegetables, and lean proteins, and low in salt and simple carbohydrates) and exercise (at least 30 minutes of moderate physical activity daily).   The above assessment and management plan was discussed with the patient. The patient verbalized understanding of and has agreed to the management plan. Patient is aware to call the clinic if they develop any new symptoms or if symptoms persist or worsen. Patient is aware when to return to the clinic for a follow-up visit. Patient educated on when it is appropriate to go to the emergency department.   Rosaline Bruns, FNP-C [facility] [phone]

## 2024-03-20 ENCOUNTER — Other Ambulatory Visit (HOSPITAL_COMMUNITY): Payer: Self-pay

## 2024-03-20 ENCOUNTER — Encounter: Payer: Self-pay | Admitting: Family Medicine

## 2024-03-20 ENCOUNTER — Telehealth: Payer: Self-pay | Admitting: Pharmacy Technician

## 2024-03-20 DIAGNOSIS — J069 Acute upper respiratory infection, unspecified: Secondary | ICD-10-CM

## 2024-03-20 MED ORDER — FLUTICASONE PROPIONATE 50 MCG/ACT NA SUSP: 2.0000 | Freq: Every day | NASAL | 3 refills | Status: DC

## 2024-03-20 NOTE — Telephone Encounter (Signed)
 Patient wants to know if new Rx for Fluticasone , with 3 months worth of refills can be sent to [facility] in [facility]?

## 2024-03-20 NOTE — Telephone Encounter (Signed)
 Pharmacy Patient Advocate Encounter   Received notification from Onbase that prior authorization for Belsomra  5MG  tablets is required/requested.   Insurance verification completed.   The patient is insured through [facility].   Per test claim: PA required; PA started via CoverMyMeds. KEY [id number] . Waiting for clinical questions to populate.

## 2024-03-20 NOTE — Telephone Encounter (Signed)
 Pharmacy Patient Advocate Encounter  Received notification from [facility] that Prior Authorization for Belsomra 5MG  tablets has been APPROVED from 03/20/24 to 03/20/25. Ran test claim, Copay is $30.00. This test claim was processed through [facility]- copay amounts may vary at other pharmacies due to pharmacy/plan contracts, or as the patient moves through the different stages of their insurance plan.   PA #/Case ID/Reference #: [id number]

## 2024-03-21 ENCOUNTER — Other Ambulatory Visit: Payer: Self-pay | Admitting: Family Medicine

## 2024-03-21 DIAGNOSIS — J069 Acute upper respiratory infection, unspecified: Secondary | ICD-10-CM

## 2024-04-02 ENCOUNTER — Encounter: Payer: Self-pay | Admitting: Family Medicine

## 2024-04-08 ENCOUNTER — Other Ambulatory Visit: Payer: Self-pay | Admitting: Family Medicine

## 2024-04-08 DIAGNOSIS — M62830 Muscle spasm of back: Secondary | ICD-10-CM

## 2024-04-22 ENCOUNTER — Ambulatory Visit: Admission: RE | Admit: 2024-04-22 | Source: Ambulatory Visit

## 2024-04-22 ENCOUNTER — Ambulatory Visit

## 2024-04-22 VITALS — BP 130/84 | HR 80 | Temp 97.9°F | Resp 16 | Ht 65.5 in | Wt 217.2 lb

## 2024-04-22 DIAGNOSIS — M545 Low back pain, unspecified: Secondary | ICD-10-CM

## 2024-04-22 DIAGNOSIS — R7689 Other specified abnormal immunological findings in serum: Secondary | ICD-10-CM | POA: Diagnosis not present

## 2024-04-22 DIAGNOSIS — M79672 Pain in left foot: Secondary | ICD-10-CM | POA: Diagnosis not present

## 2024-04-22 NOTE — Progress Notes (Signed)
 "  Office Visit Note  Patient: Rebecca [name]             Date of Birth: 19-May-1970           MRN: [id number]             PCP: Severa Rock HERO, FNP Referring: Beather Delon Gibson, PA Visit Date: 04/22/2024 Occupation: Data Unavailable  Subjective:  New Patient (Initial Visit) (Abnormal labs/)   History of Present Illness: Rebecca [name] is a 53 y.o. female   Discussed the use of AI scribe software for clinical note transcription with the patient, who gave verbal consent to proceed.  History of Present Illness Rebecca [name] is a 53 year old female who presents for an abnormal ANA.  She is concerned about elevated ANA levels, discovered during a workup for elevated liver enzymes. Her liver scan indicated hepatic steatosis. A follow-up appointment is scheduled for later this week to discuss these findings further.  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 The joint pain began immediately after the surgery and has been persistent since then. She experiences chronic lower back pain for approximately four years, which is present throughout the day and occasionally wakes her from sleep. She uses a pillow between her legs at night to alleviate discomfort. She rarely uses Tylenol  and reports minimal alcohol consumption, approximately once a month.  She has recently developed left foot pain, for which she consulted a podiatrist. She has recently developed foot pain, for which she consulted a podiatrist, who recommended orthotics and anti-inflammatory medication. The issue was suspected to be due to a long 2nd toe.   Her family history includes a son with ankylosing spondylitis. There is a history of autoimmune diseases in her family, including lupus and Parkinson's disease in her paternal great-grandparents, and rheumatoid  arthritis in her maternal grandmother.  No dry eyes, dry mouth, rashes, or photosensitivity. She reports hair loss, which she attributes to menopause, and denies any patchy hair loss or sores in her mouth. She denies raynaud's symptoms.   Activities of Daily Living:  Patient reports morning stiffness for 30 minutes.   Patient Denies nocturnal pain.  Difficulty dressing/grooming: Denies Difficulty climbing stairs: Denies Difficulty getting out of chair: Denies Difficulty using hands for taps, buttons, cutlery, and/or writing: Denies  Review of Systems  Constitutional:  Negative for fatigue.  HENT:  Negative for mouth sores and mouth dryness.   Eyes:  Negative for dryness.  Respiratory:  Negative for shortness of breath.   Cardiovascular:  Positive for palpitations. Negative for chest pain.  Gastrointestinal:  Positive for constipation. Negative for blood in stool and diarrhea.  Endocrine: Negative for increased urination.  Genitourinary:  Negative for involuntary urination.  Musculoskeletal:  Positive for joint pain, joint pain, myalgias, morning stiffness and myalgias. Negative for gait problem, joint swelling, muscle weakness and muscle tenderness.  Skin:  Positive for hair loss. Negative for color change, rash and sensitivity to sunlight.  Allergic/Immunologic: Negative for susceptible to infections.  Neurological:  Negative for dizziness and headaches.  Hematological:  Negative for swollen glands.  Psychiatric/Behavioral:  Positive for depressed mood and sleep disturbance. The patient is nervous/anxious.     PMFS History:  Patient Active Problem List   Diagnosis Date Noted   Menopausal disorder 02/27/2024   Hepatic steatosis 11/28/2023   Family history of melanoma 11/28/2023  Polyphagia 11/10/2022   Elevated cholesterol 11/10/2022   Elevated ALT measurement 10/19/2022   Other fatigue 10/18/2022   SOB (shortness of breath) on exertion 10/18/2022   Insulin  resistance  10/18/2022   Vitamin D  deficiency 10/18/2022   Health care maintenance 10/18/2022   Generalized obesity 10/18/2022   GAD (generalized anxiety disorder) 04/28/2021   Insomnia 01/14/2021   Mixed hyperlipidemia 01/14/2021   BMI 35.0-35.9,adult 01/14/2021   History of total hysterectomy 01/14/2021   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   FH: diabetes mellitus 06/30/2020   FH: breast cancer 06/30/2020   Family history of malignant neoplasm of digestive organs 02/23/2012    Past Medical History:  Diagnosis Date   Anxiety    Depression    Difficult intubation 11/09/2011   uneventful intubation using elective McGrath MAC 3 video laryngoscope 10/29/19 given prior history   Fatty liver    Hyperlipidemia    Insomnia     Family History  Problem Relation Age of Onset   Depression Mother    Heart disease Mother        Pacemaker   Hyperlipidemia Father    Colon polyps Father    Melanoma Father    Fibromyalgia Father    Fibromyalgia Sister    Colon polyps Sister    Colon polyps Brother    Heart disease Maternal Grandmother        Pacemaker   Ovarian cancer Maternal Grandmother    Uterine cancer Maternal Grandmother    Colon cancer Maternal Grandfather    Liver cancer Maternal Grandfather    Prostate cancer Maternal Grandfather    Diabetes Paternal Grandmother    Alzheimer's disease Paternal Grandfather    Ankylosing spondylitis Son    Breast cancer Neg Hx    Past Surgical History:  Procedure Laterality Date   ABDOMINAL HYSTERECTOMY  11/09/2011   hysterectomy with right ooporectomy   BREAST EXCISIONAL BIOPSY Left    COLONOSCOPY     OOPHORECTOMY Left 10/29/2019   right ooporectomy 11/09/11; left oophorectoy 10/29/19 for ovarian torsion   SHOULDER ARTHROSCOPY WITH SUBACROMIAL DECOMPRESSION AND BICEP TENDON REPAIR Right 04/19/2022   Procedure: RIGHT SHOULDER ARTHROSCOPY, SUBACROMIAL DECOMPRESSION,  BICEPS TENODESIS AND DEBRIDEMENT;  Surgeon: Addie Cordella Hamilton, MD;   Location: [facility] OR;  Service: Orthopedics;  Laterality: Right;   TONSILLECTOMY Bilateral    TUBAL LIGATION     Social History[1] Social History   Social History Narrative   Not on file     Immunization History  Administered Date(s) Administered   Hepatitis B 12/21/2019   Influenza, Seasonal, Injecte, Preservative Fre 02/04/2010, 02/10/2012   Influenza,Quad,Nasal, Live 02/08/2011, 02/07/2013   Influenza,inj,Quad PF,6+ Mos 01/29/2014, 02/14/2018, 02/03/2019, 01/14/2021, 01/18/2022   Influenza-Unspecified 02/15/2020, 01/05/2024   Moderna Sars-Covid-2 Vaccination 05/17/2019, 06/14/2019, 04/18/2020   Pfizer Covid-19 Vaccine Bivalent Booster 79yrs & up 08/07/2019, 08/28/2019   Pneumococcal Polysaccharide-23 07/19/2018   Td 06/30/2020   Tdap 06/22/2010     Objective: Vital Signs: BP 130/84 (BP Location: Right Arm, Patient Position: Sitting, Cuff Size: Normal)   Pulse 80   Temp 97.9 F (36.6 C)   Resp 16   Ht 5' 5.5 (1.664 m)   Wt 217 lb 3.2 oz (98.5 kg)   LMP 10/30/2018 Comment: total hysterectmy  BMI 35.59 kg/m    Physical Exam   Musculoskeletal Exam:   CDAI Exam: CDAI Score: -- Patient Global: --; Provider Global: -- Swollen: 0 ; Tender: 0  Joint Exam 04/22/2024   All documented joints were normal  Investigation: No additional findings.  Imaging: No results found.  Recent Labs: Lab Results  Component Value Date   WBC 7.0 01/25/2024   HGB 13.6 01/25/2024   PLT 286.0 01/25/2024   NA 140 01/25/2024   K 3.6 01/25/2024   CL 102 01/25/2024   CO2 30 01/25/2024   GLUCOSE 96 01/25/2024   BUN 10 01/25/2024   CREATININE 0.71 01/25/2024   BILITOT 0.4 01/25/2024   ALKPHOS 90 01/25/2024   AST 30 01/25/2024   ALT 73 (H) 01/25/2024   PROT 7.6 01/25/2024   ALBUMIN 4.7 01/25/2024   CALCIUM  9.6 01/25/2024    Speciality Comments: No specialty comments available.  Procedures:  No procedures performed Allergies: Patient has no known allergies.   Assessment  / Plan:     Visit Diagnoses: Positive ANA (antinuclear antibody)  Patient with moderate titer positive ANA (1:320). Patient does not have any features of SLE (no objective malar rash, inflammatory arthritis, Raynaud's, alopecia, recurrent cytopenias), Sjogren's disease (no sicca symptoms), scleroderma (no sclerodactyly, no Raynaud's).  As discussed with patient, a positive ANA need not represent presence of clinically active systemic autoimmune disease.  Can be positive in the normal population, autoimmune thyroid  disease (Graves' disease, Hashimoto's thyroiditis etc.), or infection. ANA can be positive in the healthy population at the following rates: ANA 1:40: 20%-30%, ANA 1:80: 10%-15%, ANA 1:160: 5%, ANA 1:320: 3% positive, healthy relative of an SLE patient: 5%-25% positive (usually low titers), and elderly (age >70 years): up to 70% positive at ANA titer 1:40.   Will obtain Protein / creatinine ratio, urine for completion today, but given lack of any systemic symptoms, extremely low suspicion at this time.  Patient advised to let me know immediately if she develops any of the aforementioned symptoms. Given moderate titer, will follow-up in 1 year or PRN if symptoms worsen.  Low back pain Patient with hx of low back pain that has some features of possible inflammatory etiology (prolonged AM stiffness). Given family hx, will obtain DG HIPS BILAT WITH PELVIS 3-4 VIEWS for evaluation. However, discussed with patient that this would be unrelated to her positive test result above, patient verbalized understanding.  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 Will obtain DG Foot 2 Views Left. May consider referral to podiatry or foot orthopedics for further evaluation.  Orders: Orders Placed This Encounter  Procedures   DG HIPS BILAT WITH PELVIS 3-4 VIEWS   DG Foot 2 Views Left   Protein / creatinine ratio, urine   No orders of the defined types were  placed in this encounter.   I personally spent a total of 60 minutes in the care of the patient today including preparing to see the patient, getting/reviewing separately obtained history, performing a medically appropriate exam/evaluation, counseling and educating, placing orders, documenting clinical information in the EHR, independently interpreting results, and communicating results.   Follow-Up Instructions: Return in about 1 year (around 04/22/2025).   Asberry Claw, DO  Note - This record has been created using Animal nutritionist.  Chart creation errors have been sought, but may not always  have been located. Such creation errors do not reflect on  the standard of medical care.     [1]  Social History Tobacco Use   Smoking status: Former    Current packs/day: 0.00    Types: Cigarettes    Quit date: 2015    Years since quitting: 10.9    Passive exposure: Never  Smokeless tobacco: Never  Vaping Use   Vaping status: Never Used  Substance Use Topics   Alcohol use: Yes    Comment: maybe 1 drink a month   Drug use: Never   "

## 2024-04-23 LAB — PROTEIN / CREATININE RATIO, URINE
Creatinine, Urine: 77 mg/dL (ref 20–275)
Total Protein, Urine: <4 mg/dL — ABNORMAL LOW (ref 5–24)

## 2024-04-23 NOTE — Progress Notes (Signed)
 "  Chief Complaint: Follow-up elevated ALT  HPI:    Rebecca [name] is a 53 year old female, assigned to Dr. Shila, with a past medical history as listed below including difficult intubation and fatty liver, who was referred to me by Severa Rock HERO, FNP for follow-up elevated ALT.  05/31/2023 CMP with an ALT of 70 (this is remained her baseline around the past year), other liver enzymes normal.  Lipid panel with high cholesterol.  Total cholesterol 209, triglycerides 165, LDL 138.    08/22/23 right upper quadrant ultrasound with hepatic steatosis.    01/25/2024 office visit with me.  At that time discussed she had had an elevated LFT for years and seem to be slowly increasing with time.  At that time ordered further liver serologies to rule out other causes other than fatty liver, right upper quadrant ultrasound with elastography, discussed Mediterranean diet and also discussed Wegovy  if she met guidelines.    02/01/2024 results followed up on.  Patient was started on Wegovy  for her fatty liver.  0.25 mg subcu once weekly for 4 weeks and then increase to 0.5 mg subcu weekly for 4 weeks and then 1 mg subcu weekly x 4 weeks followed by 1.7 mg subcu weekly for 4 weeks.  Discussed that I like to see her back in 2 months to see how things are going and recalculate fib 4 score with repeat hepatic function panel and CBC.    02/04/2024 right upper quadrant ultrasound with elastography showed K PA 4.7.    01/25/2024 patient's liver workup was negative.  ALT remained elevated.  ANA was high and she was sent to rheumatology.    02/12/2024 patient discussed that Wegovy  was not covered by insurance.  She asked about microdosing of GLP.  This was not recommended.    04/22/2024 patient followed with rheumatology they did not think that positive ANA was indicative of autoimmune disease.    Today, the patient presents to clinic accompanied by her husband.  She tells me that she is somewhat discouraged because prior  to seeing me she had been on a weight loss diet and lost about 12 pounds to 2 months prior and her ALT was higher than it had ever been.  Since then she has gained back some weight.  Tells me her insurance would not pay for Wegovy .  Wondering what to do going forward.    Also discusses some right sided hip pain, she recently had hip x-rays which we reviewed together today.  She does have a small amount of stool in her colon, tells me her chiropractor told her that maybe she was constipated.  She tells me she does struggle with bowel movements daily, has been using MiraLAX daily for the past 3 days and this has been helping.    Denies fever, chills or weight loss.    Past Medical History:  Diagnosis Date   Anxiety    Depression    Difficult intubation 11/09/2011   uneventful intubation using elective McGrath MAC 3 video laryngoscope 10/29/19 given prior history   Fatty liver    Hyperlipidemia    Insomnia     Past Surgical History:  Procedure Laterality Date   ABDOMINAL HYSTERECTOMY  11/09/2011   hysterectomy with right ooporectomy   BREAST EXCISIONAL BIOPSY Left    COLONOSCOPY     OOPHORECTOMY Left 10/29/2019   right ooporectomy 11/09/11; left oophorectoy 10/29/19 for ovarian torsion   SHOULDER ARTHROSCOPY WITH SUBACROMIAL DECOMPRESSION AND BICEP TENDON REPAIR Right 04/19/2022  Procedure: RIGHT SHOULDER ARTHROSCOPY, SUBACROMIAL DECOMPRESSION,  BICEPS TENODESIS AND DEBRIDEMENT;  Surgeon: Addie Cordella Hamilton, MD;  Location: [facility] OR;  Service: Orthopedics;  Laterality: Right;   TONSILLECTOMY Bilateral    TUBAL LIGATION      Current Outpatient Medications  Medication Sig Dispense Refill   amoxicillin -clavulanate (AUGMENTIN ) 875-125 MG tablet Take 1 tablet by mouth 2 (two) times daily. (Patient not taking: Reported on 04/22/2024) 20 tablet 0   atorvastatin  (LIPITOR) 20 MG tablet Take 1 tablet (20 mg total) by mouth daily. 90 tablet 1   cholecalciferol (VITAMIN D3) 25 MCG (1000 UNIT) tablet  Take 1,000 Units by mouth daily.     cyclobenzaprine  (FLEXERIL ) 5 MG tablet TAKE 1 TABLET BY MOUTH THREE TIMES A DAY AS NEEDED FOR MUSCLE SPASMS 30 tablet 0   diclofenac (VOLTAREN) 75 MG EC tablet Take 75 mg by mouth 2 (two) times daily.     doxylamine, Sleep, (UNISOM) 25 MG tablet Take 25 mg by mouth at bedtime as needed.     estradiol  (CLIMARA  - DOSED IN MG/24 HR) 0.025 mg/24hr patch Place 1 patch (0.025 mg total) onto the skin once a week. 12 patch 3   fluticasone  (FLONASE ) 50 MCG/ACT nasal spray SPRAY 2 SPRAYS INTO EACH NOSTRIL EVERY DAY 48 mL 2   semaglutide -weight management (WEGOVY ) 1 MG/0.5ML SOAJ SQ injection Inject 1 mg into the skin once a week for 28 days. (Patient not taking: Reported on 04/22/2024) 2 mL 0   [START ON 05/05/2024] semaglutide -weight management (WEGOVY ) 1.7 MG/0.75ML SOAJ SQ injection Inject 1.7 mg into the skin once a week for 28 days. (Patient not taking: Reported on 04/22/2024) 3 mL 0   [START ON 06/03/2024] semaglutide -weight management (WEGOVY ) 2.4 MG/0.75ML SOAJ SQ injection Inject 2.4 mg into the skin once a week for 28 days. (Patient not taking: Reported on 04/22/2024) 3 mL 0   Suvorexant  (BELSOMRA ) 5 MG TABS Take 1 tablet (5 mg total) by mouth at bedtime as needed (insomnia). (Patient not taking: Reported on 04/22/2024) 30 tablet 0   No current facility-administered medications for this visit.    Allergies as of 04/26/2024   (No Known Allergies)    Family History  Problem Relation Age of Onset   Depression Mother    Heart disease Mother        Pacemaker   Hyperlipidemia Father    Colon polyps Father    Melanoma Father    Fibromyalgia Father    Fibromyalgia Sister    Colon polyps Sister    Colon polyps Brother    Heart disease Maternal Grandmother        Pacemaker   Ovarian cancer Maternal Grandmother    Uterine cancer Maternal Grandmother    Colon cancer Maternal Grandfather    Liver cancer Maternal Grandfather    Prostate cancer Maternal  Grandfather    Diabetes Paternal Grandmother    Alzheimer's disease Paternal Grandfather    Ankylosing spondylitis Son    Breast cancer Neg Hx     Social History   Socioeconomic History   Marital status: Married    Spouse name: Not on file   Number of children: Not on file   Years of education: Not on file   Highest education level: Bachelor's degree (e.g., BA, AB, BS)  Occupational History   Occupation: [employer]  Tobacco Use   Smoking status: Former    Current packs/day: 0.00    Types: Cigarettes    Quit date: 2015    Years since  quitting: 10.9    Passive exposure: Never   Smokeless tobacco: Never  Vaping Use   Vaping status: Never Used  Substance and Sexual Activity   Alcohol use: Yes    Comment: maybe 1 drink a month   Drug use: Never   Sexual activity: Yes    Comment: total hysterectomy  Other Topics Concern   Not on file  Social History Narrative   Not on file   Social Drivers of Health   Tobacco Use: Medium Risk (04/22/2024)   Patient History    Smoking Tobacco Use: Former    Smokeless Tobacco Use: Never    Passive Exposure: Never  Physicist, Medical Strain: Low Risk (02/23/2024)   Overall Financial Resource Strain (CARDIA)    Difficulty of Paying Living Expenses: Not hard at all  Food Insecurity: No Food Insecurity (02/23/2024)   Epic    Worried About Programme Researcher, Broadcasting/film/video in the Last Year: Never true    Ran Out of Food in the Last Year: Never true  Transportation Needs: No Transportation Needs (02/23/2024)   Epic    Lack of Transportation (Medical): No    Lack of Transportation (Non-Medical): No  Physical Activity: Sufficiently Active (02/23/2024)   Exercise Vital Sign    Days of Exercise per Week: 5 days    Minutes of Exercise per Session: 60 min  Stress: No Stress Concern Present (02/23/2024)   [employer] of Occupational Health - Occupational Stress Questionnaire    Feeling of Stress: Only a little  Social Connections:  Moderately Isolated (02/23/2024)   Social Connection and Isolation Panel    Frequency of Communication with Friends and Family: Three times a week    Frequency of Social Gatherings with Friends and Family: Patient declined    Attends Religious Services: Patient declined    Active Member of Clubs or Organizations: No    Attends Engineer, Structural: Not on file    Marital Status: Married  Catering Manager Violence: Not on file  Depression (PHQ2-9): Low Risk (11/28/2023)   Depression (PHQ2-9)    PHQ-2 Score: 0  Alcohol Screen: Low Risk (02/23/2024)   Alcohol Screen    Last Alcohol Screening Score (AUDIT): 1  Housing: Low Risk (02/23/2024)   Epic    Unable to Pay for Housing in the Last Year: No    Number of Times Moved in the Last Year: 0    Homeless in the Last Year: No  Utilities: Not on file  Health Literacy: Not on file    Review of Systems:    Constitutional: No weight loss, fever or chills Cardiovascular: No chest pain Respiratory: No SOB Gastrointestinal: See HPI and otherwise negative   Physical Exam:  Vital signs: BP 118/80   Pulse 76   Ht 5' 5.5 (1.664 m)   Wt 216 lb 4 oz (98.1 kg)   LMP 10/30/2018 Comment: total hysterectmy  BMI 35.44 kg/m    Constitutional:   Pleasant overweight Caucasian female appears to be in NAD, Well developed, Well nourished, alert and cooperative  Respiratory: Respirations even and unlabored. Lungs clear to auscultation bilaterally.   No wheezes, crackles, or rhonchi.  Cardiovascular: Normal S1, S2. No MRG. Regular rate and rhythm. No peripheral edema, cyanosis or pallor.  Gastrointestinal:  Soft, nondistended, nontender. No rebound or guarding. Normal bowel sounds. No appreciable masses or hepatomegaly. Rectal:  Not performed.  Psychiatric: Demonstrates good judgement and reason without abnormal affect or behaviors.  RELEVANT LABS AND IMAGING: CBC  Component Value Date/Time   WBC 7.0 01/25/2024 1443   RBC 4.48  01/25/2024 1443   HGB 13.6 01/25/2024 1443   HGB 14.4 08/15/2023 1618   HCT 40.3 01/25/2024 1443   HCT 43.6 08/15/2023 1618   PLT 286.0 01/25/2024 1443   PLT 319 08/15/2023 1618   MCV 90.0 01/25/2024 1443   MCV 91 08/15/2023 1618   MCH 30.2 08/15/2023 1618   MCH 31.0 03/23/2022 1422   MCHC 33.8 01/25/2024 1443   RDW 13.1 01/25/2024 1443   RDW 13.1 08/15/2023 1618   LYMPHSABS 3.2 01/25/2024 1443   LYMPHSABS 3.6 (H) 08/15/2023 1618   MONOABS 0.5 01/25/2024 1443   EOSABS 0.1 01/25/2024 1443   EOSABS 0.1 08/15/2023 1618   BASOSABS 0.0 01/25/2024 1443   BASOSABS 0.0 08/15/2023 1618    CMP     Component Value Date/Time   NA 140 01/25/2024 1443   NA 139 11/28/2023 1100   K 3.6 01/25/2024 1443   CL 102 01/25/2024 1443   CO2 30 01/25/2024 1443   GLUCOSE 96 01/25/2024 1443   BUN 10 01/25/2024 1443   BUN 12 11/28/2023 1100   CREATININE 0.71 01/25/2024 1443   CALCIUM  9.6 01/25/2024 1443   PROT 7.6 01/25/2024 1443   PROT 7.3 11/28/2023 1100   ALBUMIN 4.7 01/25/2024 1443   ALBUMIN 4.5 11/28/2023 1100   AST 30 01/25/2024 1443   ALT 73 (H) 01/25/2024 1443   ALKPHOS 90 01/25/2024 1443   BILITOT 0.4 01/25/2024 1443   BILITOT 0.3 11/28/2023 1100   GFRNONAA >60 03/23/2022 1422    Assessment: 1.  Elevated ALT: For at least the past year and 1/2 to 2, thought related to fatty liver, full workup at last visit which was negative, at last visit briefly discussed Wegovy  and tried to prescribe this for her, but her insurance did not approve it for the indication of fatty liver, last check of labs in September with an ALT of 73, fib 4 score from September was 0.63, patient feeling well 2.  Fatty liver 3.  Screening for colorectal cancer: Reported last colonoscopy in 2019, we have yet to receive records from [facility] 4.  Constipation: Better with MiraLAX daily over the past few days, could be contributing to right sided hip pain but also has signs of osteoarthritis  Plan: 1.   Requesting records from last colonoscopy. 2.  Recheck hepatic function panel today. 3.  Discussed Wegovy  in detail.  Apparently her insurance is changing in January, told her we could try again then if she would like. 4.  Otherwise recommend a slow and steady weight loss of 1 to 2 pounds a week.  Discussed the Mediterranean diet. 5.  Reviewed all of her labs and workup.  Answered questions. 6.  Discussed constipation and recommended MiraLAX daily. 7.  Told patient she can follow with us  as needed in the future  Or for her next colonoscopy.  Delon Failing, PA-C [facility] 04/23/2024, 11:22 AM  Cc: Severa Rock HERO, FNP  "

## 2024-04-26 ENCOUNTER — Ambulatory Visit: Admitting: Physician Assistant

## 2024-04-26 ENCOUNTER — Encounter: Payer: Self-pay | Admitting: Physician Assistant

## 2024-04-26 ENCOUNTER — Other Ambulatory Visit

## 2024-04-26 ENCOUNTER — Ambulatory Visit: Payer: Self-pay | Admitting: Physician Assistant

## 2024-04-26 VITALS — BP 118/80 | HR 76 | Ht 65.5 in | Wt 216.2 lb

## 2024-04-26 DIAGNOSIS — K5900 Constipation, unspecified: Secondary | ICD-10-CM

## 2024-04-26 DIAGNOSIS — R7401 Elevation of levels of liver transaminase levels: Secondary | ICD-10-CM

## 2024-04-26 DIAGNOSIS — K760 Fatty (change of) liver, not elsewhere classified: Secondary | ICD-10-CM

## 2024-04-26 LAB — HEPATIC FUNCTION PANEL
ALT: 58 U/L — ABNORMAL HIGH (ref 3–35)
AST: 25 U/L (ref 5–37)
Albumin: 4.5 g/dL (ref 3.5–5.2)
Alkaline Phosphatase: 85 U/L (ref 39–117)
Bilirubin, Direct: 0.1 mg/dL (ref 0.1–0.3)
Total Bilirubin: 0.5 mg/dL (ref 0.2–1.2)
Total Protein: 7.5 g/dL (ref 6.0–8.3)

## 2024-04-26 NOTE — Progress Notes (Signed)
 ALT is decreased from previous, full workup otherwise negative.  This is known fatty liver.  Delon Failing, PA-C

## 2024-04-26 NOTE — Patient Instructions (Signed)
 Your provider has requested that you go to the basement level for lab work before leaving today. Press B on the elevator. The lab is located at the first door on the left as you exit the elevator.   Start Miralax 1 capful daily in 8 ounces of liquid.

## 2024-04-30 ENCOUNTER — Encounter: Payer: Self-pay | Admitting: Family Medicine

## 2024-04-30 ENCOUNTER — Ambulatory Visit (INDEPENDENT_AMBULATORY_CARE_PROVIDER_SITE_OTHER): Admitting: Family Medicine

## 2024-04-30 ENCOUNTER — Ambulatory Visit: Payer: Self-pay

## 2024-04-30 VITALS — BP 128/84 | HR 64 | Temp 97.0°F | Ht 65.5 in | Wt 215.6 lb

## 2024-04-30 DIAGNOSIS — E559 Vitamin D deficiency, unspecified: Secondary | ICD-10-CM | POA: Diagnosis not present

## 2024-04-30 DIAGNOSIS — E8941 Symptomatic postprocedural ovarian failure: Secondary | ICD-10-CM

## 2024-04-30 DIAGNOSIS — M25551 Pain in right hip: Secondary | ICD-10-CM | POA: Diagnosis not present

## 2024-04-30 DIAGNOSIS — G8929 Other chronic pain: Secondary | ICD-10-CM | POA: Diagnosis not present

## 2024-04-30 DIAGNOSIS — Z90710 Acquired absence of both cervix and uterus: Secondary | ICD-10-CM | POA: Diagnosis not present

## 2024-04-30 DIAGNOSIS — F5101 Primary insomnia: Secondary | ICD-10-CM

## 2024-04-30 DIAGNOSIS — N959 Unspecified menopausal and perimenopausal disorder: Secondary | ICD-10-CM | POA: Diagnosis not present

## 2024-04-30 DIAGNOSIS — E782 Mixed hyperlipidemia: Secondary | ICD-10-CM

## 2024-04-30 MED ORDER — METHYLPREDNISOLONE ACETATE 80 MG/ML IJ SUSP
80.0000 mg | Freq: Once | INTRAMUSCULAR | Status: AC
  Administered 2024-04-30: 60 mg via INTRAMUSCULAR

## 2024-04-30 MED ORDER — ATORVASTATIN CALCIUM 20 MG PO TABS: 20.0000 mg | ORAL_TABLET | Freq: Every day | ORAL | 1 refills | Status: AC

## 2024-04-30 NOTE — Progress Notes (Signed)
 "    Subjective:  Patient ID: Rebecca [name], female    DOB: [birth year], 53 y.o.   MRN: [id number]  Patient Care Team: Severa Rock HERO, FNP as PCP - General (Family Medicine)   Chief Complaint:  Medical Management of Chronic Issues   HPI: Rebecca [name] is a 53 y.o. female presenting on 04/30/2024 for Medical Management of Chronic Issues   Rebecca [name] is a 53 year old female who presents for chronic follow- up and follow-up on her liver condition and management of hip pain.  She has been managing her liver condition, previously monitored by a specialist. Her liver function tests are currently stable, and a rheumatology workup was mostly normal except for a moderately elevated marker. She has undergone x-rays as part of her evaluation. She is unable to obtain Wegovy  for her fatty liver due to insurance denial, with her insurance being [employer].  There was a misunderstanding regarding her alcohol intake, which was incorrectly recorded as one to two drinks per week. She clarifies that she drinks only on special occasions, such as Thanksgiving and Christmas, and often goes months without drinking. She uses Tylenol  occasionally, ensuring not to exceed 2000 mg per day.  She has experienced right lower hip pain for about four years. She has tried chiropractic care, massage, physical therapy, and topical treatments like Biofreeze, heat, and ice, but these have not provided significant relief. She is active, walking an hour daily, and is considering weight loss to help manage her symptoms.  She is using a hormone patch at the lowest dose, which has helped with hot flashes, sleep, mood, and brain fog. She previously tried Belsomra  for sleep but discontinued it after three nights due to adverse effects, including anxiety and headaches.  She has a congenital foot abnormality, specifically a longer second toe, confirmed by a podiatrist and x-ray. She was advised to wear specific types  of shoes for comfort.  She has not had a sleep study and reports no issues with daytime fatigue or loud snoring. She feels generally well-rested and does not nap during the day.          Relevant past medical, surgical, family, and social history reviewed and updated as indicated.  Allergies and medications reviewed and updated. Data reviewed: Chart in Epic.   Past Medical History:  Diagnosis Date   Anxiety    Depression    Difficult intubation 11/09/2011   uneventful intubation using elective McGrath MAC 3 video laryngoscope 10/29/19 given prior history   Fatty liver    Hyperlipidemia    Insomnia     Past Surgical History:  Procedure Laterality Date   ABDOMINAL HYSTERECTOMY  11/09/2011   hysterectomy with right ooporectomy   BREAST EXCISIONAL BIOPSY Left    COLONOSCOPY     OOPHORECTOMY Left 10/29/2019   right ooporectomy 11/09/11; left oophorectoy 10/29/19 for ovarian torsion   SHOULDER ARTHROSCOPY WITH SUBACROMIAL DECOMPRESSION AND BICEP TENDON REPAIR Right 04/19/2022   Procedure: RIGHT SHOULDER ARTHROSCOPY, SUBACROMIAL DECOMPRESSION,  BICEPS TENODESIS AND DEBRIDEMENT;  Surgeon: Addie Cordella Hamilton, MD;  Location: [facility] OR;  Service: Orthopedics;  Laterality: Right;   TONSILLECTOMY Bilateral    TUBAL LIGATION      Social History   Socioeconomic History   Marital status: Married    Spouse name: Not on file   Number of children: 3   Years of education: Not on file   Highest education level: Bachelor's degree (e.g., BA, AB, BS)  Occupational History   Occupation: [employer]  Tobacco Use   Smoking status: Former    Current packs/day: 0.00    Types: Cigarettes    Quit date: 2015    Years since quitting: 11.0    Passive exposure: Never   Smokeless tobacco: Never  Vaping Use   Vaping status: Never Used  Substance and Sexual Activity   Alcohol use: Yes    Comment: maybe 1 drink a month   Drug use: Never   Sexual activity: Yes    Comment: total  hysterectomy  Other Topics Concern   Not on file  Social History Narrative   Not on file   Social Drivers of Health   Tobacco Use: Medium Risk (04/30/2024)   Patient History    Smoking Tobacco Use: Former    Smokeless Tobacco Use: Never    Passive Exposure: Never  Physicist, Medical Strain: Low Risk (02/23/2024)   Overall Financial Resource Strain (CARDIA)    Difficulty of Paying Living Expenses: Not hard at all  Food Insecurity: No Food Insecurity (02/23/2024)   Epic    Worried About Programme Researcher, Broadcasting/film/video in the Last Year: Never true    Ran Out of Food in the Last Year: Never true  Transportation Needs: No Transportation Needs (02/23/2024)   Epic    Lack of Transportation (Medical): No    Lack of Transportation (Non-Medical): No  Physical Activity: Sufficiently Active (02/23/2024)   Exercise Vital Sign    Days of Exercise per Week: 5 days    Minutes of Exercise per Session: 60 min  Stress: No Stress Concern Present (02/23/2024)   [employer] of Occupational Health - Occupational Stress Questionnaire    Feeling of Stress: Only a little  Social Connections: Moderately Isolated (02/23/2024)   Social Connection and Isolation Panel    Frequency of Communication with Friends and Family: Three times a week    Frequency of Social Gatherings with Friends and Family: Patient declined    Attends Religious Services: Patient declined    Active Member of Clubs or Organizations: No    Attends Engineer, Structural: Not on file    Marital Status: Married  Catering Manager Violence: Not on file  Depression (PHQ2-9): Low Risk (04/30/2024)   Depression (PHQ2-9)    PHQ-2 Score: 0  Alcohol Screen: Low Risk (02/23/2024)   Alcohol Screen    Last Alcohol Screening Score (AUDIT): 1  Housing: Low Risk (02/23/2024)   Epic    Unable to Pay for Housing in the Last Year: No    Number of Times Moved in the Last Year: 0    Homeless in the Last Year: No  Utilities: Not on file  Health  Literacy: Not on file    Outpatient Encounter Medications as of 04/30/2024  Medication Sig   cholecalciferol (VITAMIN D3) 25 MCG (1000 UNIT) tablet Take 1,000 Units by mouth daily.   cyclobenzaprine  (FLEXERIL ) 5 MG tablet TAKE 1 TABLET BY MOUTH THREE TIMES A DAY AS NEEDED FOR MUSCLE SPASMS   doxylamine, Sleep, (UNISOM) 25 MG tablet Take 25 mg by mouth at bedtime as needed.   estradiol  (CLIMARA  - DOSED IN MG/24 HR) 0.025 mg/24hr patch Place 1 patch (0.025 mg total) onto the skin once a week.   fluticasone  (FLONASE ) 50 MCG/ACT nasal spray SPRAY 2 SPRAYS INTO EACH NOSTRIL EVERY DAY   [DISCONTINUED] atorvastatin  (LIPITOR) 20 MG tablet Take 1 tablet (20 mg total) by mouth daily.   atorvastatin  (LIPITOR) 20 MG tablet Take 1 tablet (20 mg total) by  mouth daily.   [DISCONTINUED] amoxicillin -clavulanate (AUGMENTIN ) 875-125 MG tablet Take 1 tablet by mouth 2 (two) times daily. (Patient not taking: Reported on 04/22/2024)   [DISCONTINUED] diclofenac (VOLTAREN) 75 MG EC tablet Take 75 mg by mouth 2 (two) times daily.   [DISCONTINUED] semaglutide -weight management (WEGOVY ) 1 MG/0.5ML SOAJ SQ injection Inject 1 mg into the skin once a week for 28 days. (Patient not taking: Reported on 04/26/2024)   [DISCONTINUED] semaglutide -weight management (WEGOVY ) 1.7 MG/0.75ML SOAJ SQ injection Inject 1.7 mg into the skin once a week for 28 days. (Patient not taking: Reported on 04/22/2024)   [DISCONTINUED] semaglutide -weight management (WEGOVY ) 2.4 MG/0.75ML SOAJ SQ injection Inject 2.4 mg into the skin once a week for 28 days. (Patient not taking: Reported on 04/22/2024)   [DISCONTINUED] Suvorexant  (BELSOMRA ) 5 MG TABS Take 1 tablet (5 mg total) by mouth at bedtime as needed (insomnia). (Patient not taking: Reported on 04/26/2024)   [EXPIRED] methylPREDNISolone  acetate (DEPO-MEDROL ) injection 80 mg    No facility-administered encounter medications on file as of 04/30/2024.    Allergies[1]  Pertinent ROS per HPI,  otherwise unremarkable      Objective:  BP 128/84   Pulse 64   Temp (!) 97 F (36.1 C)   Ht 5' 5.5 (1.664 m)   Wt 215 lb 9.6 oz (97.8 kg)   LMP 10/30/2018 Comment: total hysterectmy  SpO2 100%   BMI 35.33 kg/m    Wt Readings from Last 3 Encounters:  04/30/24 215 lb 9.6 oz (97.8 kg)  04/26/24 216 lb 4 oz (98.1 kg)  04/22/24 217 lb 3.2 oz (98.5 kg)    Physical Exam Vitals and nursing note reviewed.  Constitutional:      General: She is not in acute distress.    Appearance: Normal appearance. She is obese. She is not ill-appearing, toxic-appearing or diaphoretic.  HENT:     Head: Normocephalic and atraumatic.     Nose: Nose normal.     Mouth/Throat:     Mouth: Mucous membranes are moist.  Eyes:     Pupils: Pupils are equal, round, and reactive to light.  Cardiovascular:     Rate and Rhythm: Normal rate and regular rhythm.     Heart sounds: Normal heart sounds.  Pulmonary:     Effort: Pulmonary effort is normal.     Breath sounds: Normal breath sounds.  Musculoskeletal:     Cervical back: Normal range of motion and neck supple.     Right lower leg: No edema.     Left lower leg: No edema.  Skin:    General: Skin is warm and dry.     Capillary Refill: Capillary refill takes less than 2 seconds.  Neurological:     General: No focal deficit present.     Mental Status: She is alert and oriented to person, place, and time.  Psychiatric:        Mood and Affect: Mood normal.        Behavior: Behavior normal.        Thought Content: Thought content normal.        Judgment: Judgment normal.       Results for orders placed or performed in visit on 04/26/24  Hepatic function panel   Collection Time: 04/26/24  9:43 AM  Result Value Ref Range   Total Bilirubin 0.5 0.2 - 1.2 mg/dL   Bilirubin, Direct 0.1 0.1 - 0.3 mg/dL   Alkaline Phosphatase 85 39 - 117 U/L   AST 25 5 -  37 U/L   ALT 58 (H) 3 - 35 U/L   Total Protein 7.5 6.0 - 8.3 g/dL   Albumin 4.5 3.5 - 5.2 g/dL        Pertinent labs & imaging results that were available during my care of the patient were reviewed by me and considered in my medical decision making.  Assessment & Plan:  Dailynn was seen today for medical management of chronic issues.  Diagnoses and all orders for this visit:  Menopausal disorder -     CMP14+EGFR -     Hormone Panel -     CBC with Differential/Platelet  Mixed hyperlipidemia -     CMP14+EGFR -     Lipid panel -     atorvastatin  (LIPITOR) 20 MG tablet; Take 1 tablet (20 mg total) by mouth daily.  Vitamin D  deficiency -     CMP14+EGFR  History of total hysterectomy -     CMP14+EGFR -     Hormone Panel -     CBC with Differential/Platelet  Hot flashes due to surgical menopause -     CMP14+EGFR -     Hormone Panel  Primary insomnia -     CMP14+EGFR -     Hormone Panel  Chronic right hip pain -     methylPREDNISolone  acetate (DEPO-MEDROL ) injection 80 mg      Chronic right hip pain with sacroiliitis Chronic right hip pain for four years, likely due to sacroiliitis. Previous treatments include chiropractic care, massage, and physical therapy. Pain is deep and not relieved by topical treatments. Weight loss is challenging due to insurance issues with Wegovy . - Administered steroid injection for immediate pain relief. - Will consider long-term anti-inflammatory medication such as Mobic  or Celebrex  if pain persists.  Menopausal symptoms after hysterectomy Menopausal symptoms including hot flashes, mood changes, and brain fog. Current treatment with the lowest dose estrogen patch has improved symptoms but not completely resolved them. Previous hormone profile was normal, but will be re-evaluated with current patch use. Insurance coverage for additional hormone therapies is uncertain. - Ordered hormone profile to assess current hormone levels with estrogen patch use. - Will consider increasing estrogen patch dose if hormone levels indicate need.  Primary  insomnia Insomnia previously treated with Belsomra , which was ineffective and caused anxiety and headaches. Current treatment with estrogen patch has improved sleep quality. - Continue current management with estrogen patch.  Mixed hyperlipidemia Concerns about medication affecting liver function.  General health maintenance Discussion about pneumonia vaccination. No chronic lung disease, but pneumonia as a child. Recommended vaccination at age 70. - Recommended pneumonia vaccination at age 15.          Continue all other maintenance medications.  Follow up plan: Return in 6 months (on 10/29/2024), or if symptoms worsen or fail to improve, for Annual Physical.   Continue healthy lifestyle choices, including diet (rich in fruits, vegetables, and lean proteins, and low in salt and simple carbohydrates) and exercise (at least 30 minutes of moderate physical activity daily).  Educational handout given for hip pain  The above assessment and management plan was discussed with the patient. The patient verbalized understanding of and has agreed to the management plan. Patient is aware to call the clinic if they develop any new symptoms or if symptoms persist or worsen. Patient is aware when to return to the clinic for a follow-up visit. Patient educated on when it is appropriate to go to the emergency department.   Rosaline Bruns, FNP-C [facility] [phone]     [1] No Known Allergies  "

## 2024-05-01 ENCOUNTER — Encounter: Payer: Self-pay | Admitting: Family Medicine

## 2024-05-01 ENCOUNTER — Ambulatory Visit: Payer: Self-pay | Admitting: Family Medicine

## 2024-05-10 LAB — CMP14+EGFR
ALT: 72 IU/L — ABNORMAL HIGH (ref 0–32)
AST: 37 IU/L (ref 0–40)
Albumin: 4.7 g/dL (ref 3.8–4.9)
Alkaline Phosphatase: 111 IU/L (ref 49–135)
BUN/Creatinine Ratio: 13 (ref 9–23)
BUN: 10 mg/dL (ref 6–24)
Bilirubin Total: 0.5 mg/dL (ref 0.0–1.2)
CO2: 25 mmol/L (ref 20–29)
Calcium: 9.8 mg/dL (ref 8.7–10.2)
Chloride: 101 mmol/L (ref 96–106)
Creatinine, Ser: 0.79 mg/dL (ref 0.57–1.00)
Globulin, Total: 2.5 g/dL (ref 1.5–4.5)
Glucose: 86 mg/dL (ref 70–99)
Potassium: 4.1 mmol/L (ref 3.5–5.2)
Sodium: 139 mmol/L (ref 134–144)
Total Protein: 7.2 g/dL (ref 6.0–8.5)
eGFR: 91 mL/min/1.73

## 2024-05-10 LAB — HORMONE PANEL (T4,TSH,FSH,TESTT,SHBG,DHEA,ETC)
DHEA-Sulfate, LCMS: 98 ug/dL
Estradiol, Serum, MS: 12 pg/mL
Estrone Sulfate: 127 ng/dL
Follicle Stimulating Hormone: 49 m[IU]/mL
Free T-3: 3.3 pg/mL
Free Testosterone, Serum: 2.6 pg/mL
Progesterone, Serum: <10 ng/dL
Sex Hormone Binding Globulin: 29.2 nmol/L
T4: 8.1 ug/dL
TSH: 1.8 uU/mL
Testosterone, Serum (Total): 13 ng/dL
Testosterone-% Free: 2 %
Triiodothyronine (T-3), Serum: 129 ng/dL

## 2024-05-10 LAB — LIPID PANEL
Chol/HDL Ratio: 4.1 ratio (ref 0.0–4.4)
Cholesterol, Total: 186 mg/dL (ref 100–199)
HDL: 45 mg/dL
LDL Chol Calc (NIH): 118 mg/dL — ABNORMAL HIGH (ref 0–99)
Triglycerides: 126 mg/dL (ref 0–149)
VLDL Cholesterol Cal: 23 mg/dL (ref 5–40)

## 2024-05-10 LAB — CBC WITH DIFFERENTIAL/PLATELET
Basophils Absolute: 0 x10E3/uL (ref 0.0–0.2)
Basos: 1 %
EOS (ABSOLUTE): 0.1 x10E3/uL (ref 0.0–0.4)
Eos: 1 %
Hematocrit: 44.7 % (ref 34.0–46.6)
Hemoglobin: 14.3 g/dL (ref 11.1–15.9)
Immature Grans (Abs): 0 x10E3/uL (ref 0.0–0.1)
Immature Granulocytes: 0 %
Lymphocytes Absolute: 2.8 x10E3/uL (ref 0.7–3.1)
Lymphs: 44 %
MCH: 30.7 pg (ref 26.6–33.0)
MCHC: 32 g/dL (ref 31.5–35.7)
MCV: 96 fL (ref 79–97)
Monocytes Absolute: 0.4 x10E3/uL (ref 0.1–0.9)
Monocytes: 7 %
Neutrophils Absolute: 3 x10E3/uL (ref 1.4–7.0)
Neutrophils: 47 %
Platelets: 320 x10E3/uL (ref 150–450)
RBC: 4.66 x10E6/uL (ref 3.77–5.28)
RDW: 13.6 % (ref 11.7–15.4)
WBC: 6.3 x10E3/uL (ref 3.4–10.8)

## 2024-05-13 ENCOUNTER — Telehealth: Payer: Self-pay | Admitting: Family Medicine

## 2024-05-13 NOTE — Telephone Encounter (Signed)
 Copied from CRM #[id number]. Topic: General - Call Back - No Documentation >> May 13, 2024 12:17 PM Cherylann RAMAN wrote: Reason for CRM: Patient returning call to provider Rakes' nurse. Please contact patient at [phone]. Patient available the remainder of the day.

## 2024-05-13 NOTE — Telephone Encounter (Signed)
 Contacted opatient and went over her labs

## 2024-05-29 ENCOUNTER — Ambulatory Visit: Admitting: Family Medicine

## 2024-06-05 ENCOUNTER — Encounter: Payer: Self-pay | Admitting: Family Medicine

## 2024-06-07 ENCOUNTER — Other Ambulatory Visit: Payer: Self-pay | Admitting: Family Medicine

## 2024-06-07 DIAGNOSIS — Z1231 Encounter for screening mammogram for malignant neoplasm of breast: Secondary | ICD-10-CM

## 2024-06-10 ENCOUNTER — Inpatient Hospital Stay: Admission: RE | Admit: 2024-06-10 | Source: Ambulatory Visit

## 2024-07-30 ENCOUNTER — Ambulatory Visit (INDEPENDENT_AMBULATORY_CARE_PROVIDER_SITE_OTHER): Admitting: Dermatology

## 2025-02-12 ENCOUNTER — Ambulatory Visit: Admitting: Dermatology

## 2025-04-15 ENCOUNTER — Ambulatory Visit
